# Patient Record
Sex: Female | Born: 1975 | Race: White | Hispanic: No | Smoking: Never smoker
Health system: Southern US, Community
[De-identification: ages and names within clinical notes are randomized; demographics above are authoritative.]

## PROBLEM LIST (undated history)

## (undated) DIAGNOSIS — R569 Unspecified convulsions: Secondary | ICD-10-CM

## (undated) DIAGNOSIS — E46 Unspecified protein-calorie malnutrition: Secondary | ICD-10-CM

## (undated) DIAGNOSIS — K219 Gastro-esophageal reflux disease without esophagitis: Secondary | ICD-10-CM

## (undated) DIAGNOSIS — K900 Celiac disease: Secondary | ICD-10-CM

## (undated) DIAGNOSIS — K589 Irritable bowel syndrome without diarrhea: Secondary | ICD-10-CM

## (undated) DIAGNOSIS — F988 Other specified behavioral and emotional disorders with onset usually occurring in childhood and adolescence: Secondary | ICD-10-CM

## (undated) DIAGNOSIS — F419 Anxiety disorder, unspecified: Secondary | ICD-10-CM

## (undated) DIAGNOSIS — C801 Malignant (primary) neoplasm, unspecified: Secondary | ICD-10-CM

## (undated) HISTORY — DX: Unspecified protein-calorie malnutrition: E46

## (undated) HISTORY — DX: Gastro-esophageal reflux disease without esophagitis: K21.9

## (undated) HISTORY — DX: Irritable bowel syndrome, unspecified: K58.9

## (undated) HISTORY — PX: HEMATOMA EVACUATION: SHX5118

## (undated) HISTORY — PX: HIP SURGERY: SHX245

## (undated) HISTORY — PX: ABDOMINAL SURGERY: SHX537

---

## 2003-12-17 ENCOUNTER — Ambulatory Visit: Payer: Self-pay | Admitting: Obstetrics & Gynecology

## 2008-04-26 ENCOUNTER — Ambulatory Visit: Payer: Self-pay | Admitting: Sports Medicine

## 2010-03-12 ENCOUNTER — Ambulatory Visit: Payer: Self-pay | Admitting: Internal Medicine

## 2017-01-08 ENCOUNTER — Encounter: Payer: Self-pay | Admitting: Emergency Medicine

## 2017-01-08 ENCOUNTER — Emergency Department
Admission: EM | Admit: 2017-01-08 | Discharge: 2017-01-08 | Disposition: A | Discharge status: Home / Self Care | Payer: Self-pay | Attending: Emergency Medicine | Admitting: Emergency Medicine

## 2017-01-08 DIAGNOSIS — Z5321 Procedure and treatment not carried out due to patient leaving prior to being seen by health care provider: Secondary | ICD-10-CM | POA: Insufficient documentation

## 2017-01-08 DIAGNOSIS — M79671 Pain in right foot: Secondary | ICD-10-CM | POA: Insufficient documentation

## 2017-01-08 HISTORY — DX: Unspecified convulsions: R56.9

## 2017-01-08 NOTE — ED Notes (Signed)
Left after triage; insurance is not covered at [employer].

## 2017-01-08 NOTE — ED Triage Notes (Signed)
Had fracture in rightg foot/toe and seen at [facility].  Today she re injured it by dropping a box of rocks on it.

## 2018-03-31 ENCOUNTER — Ambulatory Visit (INDEPENDENT_AMBULATORY_CARE_PROVIDER_SITE_OTHER): Payer: BLUE CROSS/BLUE SHIELD

## 2018-03-31 ENCOUNTER — Encounter: Payer: Self-pay | Admitting: Emergency Medicine

## 2018-03-31 ENCOUNTER — Ambulatory Visit
Admission: EM | Admit: 2018-03-31 | Discharge: 2018-03-31 | Disposition: A | Discharge status: Home / Self Care | Payer: BLUE CROSS/BLUE SHIELD | Attending: Family Medicine | Admitting: Family Medicine

## 2018-03-31 ENCOUNTER — Other Ambulatory Visit: Payer: Self-pay

## 2018-03-31 DIAGNOSIS — S2241XA Multiple fractures of ribs, right side, initial encounter for closed fracture: Secondary | ICD-10-CM | POA: Diagnosis not present

## 2018-03-31 DIAGNOSIS — W541XXA Struck by dog, initial encounter: Secondary | ICD-10-CM | POA: Diagnosis not present

## 2018-03-31 MED ORDER — KETOROLAC TROMETHAMINE 60 MG/2ML IM SOLN
60.0000 mg | Freq: Once | INTRAMUSCULAR | Status: AC
  Administered 2018-03-31: 60 mg via INTRAMUSCULAR

## 2018-03-31 MED ORDER — TRAMADOL HCL 50 MG PO TABS: 50.0000 mg | ORAL_TABLET | Freq: Four times a day (QID) | ORAL | 0 refills | Status: DC | PRN

## 2018-03-31 NOTE — ED Provider Notes (Signed)
[facility]    CSN: [id number] Arrival date & time: 03/31/18  1214     History   Chief Complaint Chief Complaint  Patient presents with  . Chest Pain    HPI Crystal Mckinney is a 43 y.o. female.   HPI  Female accompanied by her husband presents with a right posterior rib pain.  She states that 4 days ago her dog became alarmed and jumped on her pushing her into a corner of the wall. that time she is noticed severe bruising and pain particularly motion.  In addition she has noticed "swelling" in her abdomen.  She normally has a 6 pack from her working out but now i is not as defined.  He has had no nausea vomiting diarrhea.       Past Medical History:  Diagnosis Date  . Seizures ([place])     There are no active problems to display for this patient.   Past Surgical History:  Procedure Laterality Date  . HIP SURGERY Left     OB History   No obstetric history on file.      Home Medications    Prior to Admission medications   Medication Sig Start Date End Date Taking? Authorizing Provider  lamoTRIgine (LAMICTAL) 100 MG tablet Take by mouth. 01/15/18 01/15/19 Yes [provider]  levETIRAcetam (KEPPRA) 500 MG tablet Take by mouth. 01/15/18 01/15/19 Yes [provider]  LORazepam (ATIVAN) 1 MG tablet  03/17/18  Yes [provider]  methylphenidate 27 MG PO TB24 Take by mouth. 10/15/17  Yes [provider]  traMADol (ULTRAM) 50 MG tablet Take 1 tablet (50 mg total) by mouth every 6 (six) hours as needed. 03/31/18   Lorin Picket, PA-C    Family History Family History  Problem Relation Age of Onset  . Healthy Mother   . Healthy Father     Social History Social History   Tobacco Use  . Smoking status: Never Smoker  . Smokeless tobacco: Never Used  Substance Use Topics  . Alcohol use: Yes  . Drug use: Never     Allergies   Dilantin [phenytoin] and Penicillins   Review of Systems Review of Systems    Constitutional: Positive for activity change and appetite change. Negative for chills, fatigue and fever.  Cardiovascular: Positive for chest pain.  Musculoskeletal: Positive for back pain.  All other systems reviewed and are negative.    Physical Exam Triage Vital Signs ED Triage Vitals  Enc Vitals Group     BP 03/31/18 1323 96/69     Pulse Rate 03/31/18 1323 79     Resp 03/31/18 1323 18     Temp 03/31/18 1323 98.5 F (36.9 C)     Temp Source 03/31/18 1323 Oral     SpO2 03/31/18 1323 97 %     Weight 03/31/18 1317 122 lb (55.3 kg)     Height 03/31/18 1317 5' 5"  (1.651 m)     Head Circumference --      Peak Flow --      Pain Score 03/31/18 1316 10     Pain Loc --      Pain Edu? --      Excl. in [place]? --    No data found.  Updated Vital Signs BP 96/69 (BP Location: Left Arm)   Pulse 79   Temp 98.5 F (36.9 C) (Oral)   Resp 18   Ht 5' 5"  (1.651 m)   Wt 122 lb (55.3  kg)   LMP 03/08/2018   SpO2 97%   BMI 20.30 kg/m   Visual Acuity Right Eye Distance:   Left Eye Distance:   Bilateral Distance:    Right Eye Near:   Left Eye Near:    Bilateral Near:     Physical Exam Vitals signs and nursing note reviewed.  Constitutional:      General: She is not in acute distress.    Appearance: She is well-developed and normal weight. She is not ill-appearing, toxic-appearing or diaphoretic.  HENT:     Head: Normocephalic.  Neck:     Musculoskeletal: Normal range of motion.  Chest:     Chest wall: Tenderness and crepitus present.  Abdominal:     General: Bowel sounds are normal. There is no abdominal bruit.     Palpations: Abdomen is soft. There is no hepatomegaly.     Tenderness: There is abdominal tenderness. There is no guarding or rebound.  Skin:    General: Skin is warm and dry.  Neurological:     General: No focal deficit present.     Mental Status: She is alert and oriented to person, place, and time.  Psychiatric:        Mood and Affect: Mood normal.         Behavior: Behavior normal.      UC Treatments / Results  Labs (all labs ordered are listed, but only abnormal results are displayed) Labs Reviewed - No data to display  EKG None  Radiology Dg Ribs Unilateral W/chest Right  Result Date: 03/31/2018 CLINICAL DATA:  43 year old female with a history of right posterior rib pain and bruising from a fall EXAM: RIGHT RIBS AND CHEST - 3+ VIEW COMPARISON:  None. FINDINGS: Cardiomediastinal silhouette within normal limits. No evidence of central vascular congestion. No pneumothorax or pleural effusion. Minimally displaced posterior right eighth rib fracture. Nondisplaced posterior right ninth rib fracture IMPRESSION: Negative for acute cardiopulmonary disease. Posterior right eighth and ninth rib fractures. Electronically Signed   By: Corrie Mckusick D.O.   On: 03/31/2018 14:42    Procedures Procedures (including critical care time)  Medications Ordered in UC Medications  ketorolac (TORADOL) injection 60 mg (60 mg Intramuscular Given 03/31/18 1411)    Initial Impression / Assessment and Plan / UC Course  I have reviewed the triage vital signs and the nursing notes.  Pertinent labs & imaging results that were available during my care of the patient were reviewed by me and considered in my medical decision making (see chart for details).    Reviewed x-rays with the patient and her husband.  The minimally displaced fracture of the eighth rib and also with a nondisplaced fracture of the ninth rib.  Needs to cough and deep breathe despite  the pain.  Will be less if she sleeps on that side or also is able to apply counterpressure with coughing and deep breathing.  Her husband has an incentive spirometer may use to assist with her breathing exercises.  Given a prescription for tramadol for pain.  But have recommended the use of Tylenol 500 mg combined with ibuprofen 400 mg together for pain control.  Follow-up with her primary care physician in 2 weeks.   If she has any changes in her abdomen or has severe shortness of breath or any changes she should go to the emergency room   Final Clinical Impressions(s) / UC Diagnoses   Final diagnoses:  Closed fracture of multiple ribs of right side, initial encounter  Discharge Instructions     Cough and deep breathe frequently.  Try to avoid symptoms as much as possible.  Follow up with your primary care physician in 2 to 3 weeks    ED Prescriptions    Medication Sig Dispense Auth. Provider   traMADol (ULTRAM) 50 MG tablet Take 1 tablet (50 mg total) by mouth every 6 (six) hours as needed. 30 tablet Lorin Picket, PA-C     Controlled Substance Prescriptions [place] Controlled Substance Registry consulted? Not Applicable   Lorin Picket, PA-C 03/31/18 1521

## 2018-03-31 NOTE — Discharge Instructions (Signed)
Cough and deep breathe frequently.  Try to avoid symptoms as much as possible.  Follow up with your primary care physician in 2 to 3 weeks

## 2018-03-31 NOTE — ED Triage Notes (Signed)
Pt c/o right sided rib pain. Her dog jumped on her and knocked her against the point of a wall. This happened about 4 days ago. She has briusing int he area.

## 2018-04-22 ENCOUNTER — Emergency Department: Payer: BLUE CROSS/BLUE SHIELD

## 2018-04-22 ENCOUNTER — Encounter: Payer: Self-pay | Admitting: Emergency Medicine

## 2018-04-22 ENCOUNTER — Ambulatory Visit
Admission: EM | Admit: 2018-04-22 | Discharge: 2018-04-22 | Disposition: A | Discharge status: Home / Self Care | Payer: BLUE CROSS/BLUE SHIELD | Source: Home / Self Care | Attending: Family Medicine | Admitting: Family Medicine

## 2018-04-22 ENCOUNTER — Ambulatory Visit (INDEPENDENT_AMBULATORY_CARE_PROVIDER_SITE_OTHER): Payer: BLUE CROSS/BLUE SHIELD

## 2018-04-22 ENCOUNTER — Observation Stay
Admission: EM | Admit: 2018-04-22 | Discharge: 2018-04-24 | Disposition: A | Discharge status: Home / Self Care | Payer: BLUE CROSS/BLUE SHIELD | Attending: Internal Medicine | Admitting: Internal Medicine

## 2018-04-22 ENCOUNTER — Other Ambulatory Visit: Payer: Self-pay

## 2018-04-22 DIAGNOSIS — Z23 Encounter for immunization: Secondary | ICD-10-CM | POA: Diagnosis not present

## 2018-04-22 DIAGNOSIS — R197 Diarrhea, unspecified: Secondary | ICD-10-CM | POA: Insufficient documentation

## 2018-04-22 DIAGNOSIS — K900 Celiac disease: Secondary | ICD-10-CM | POA: Diagnosis not present

## 2018-04-22 DIAGNOSIS — R109 Unspecified abdominal pain: Principal | ICD-10-CM | POA: Insufficient documentation

## 2018-04-22 DIAGNOSIS — K859 Acute pancreatitis without necrosis or infection, unspecified: Secondary | ICD-10-CM | POA: Diagnosis not present

## 2018-04-22 DIAGNOSIS — F988 Other specified behavioral and emotional disorders with onset usually occurring in childhood and adolescence: Secondary | ICD-10-CM | POA: Diagnosis not present

## 2018-04-22 DIAGNOSIS — S2241XA Multiple fractures of ribs, right side, initial encounter for closed fracture: Secondary | ICD-10-CM | POA: Diagnosis not present

## 2018-04-22 DIAGNOSIS — R739 Hyperglycemia, unspecified: Secondary | ICD-10-CM | POA: Diagnosis not present

## 2018-04-22 DIAGNOSIS — K8500 Idiopathic acute pancreatitis without necrosis or infection: Secondary | ICD-10-CM

## 2018-04-22 DIAGNOSIS — K760 Fatty (change of) liver, not elsewhere classified: Secondary | ICD-10-CM | POA: Diagnosis not present

## 2018-04-22 DIAGNOSIS — Z79899 Other long term (current) drug therapy: Secondary | ICD-10-CM | POA: Diagnosis not present

## 2018-04-22 DIAGNOSIS — R140 Abdominal distension (gaseous): Secondary | ICD-10-CM | POA: Diagnosis present

## 2018-04-22 DIAGNOSIS — G40909 Epilepsy, unspecified, not intractable, without status epilepticus: Secondary | ICD-10-CM | POA: Diagnosis not present

## 2018-04-22 DIAGNOSIS — Z975 Presence of (intrauterine) contraceptive device: Secondary | ICD-10-CM | POA: Diagnosis not present

## 2018-04-22 DIAGNOSIS — F419 Anxiety disorder, unspecified: Secondary | ICD-10-CM | POA: Insufficient documentation

## 2018-04-22 DIAGNOSIS — X58XXXA Exposure to other specified factors, initial encounter: Secondary | ICD-10-CM | POA: Insufficient documentation

## 2018-04-22 HISTORY — DX: Other specified behavioral and emotional disorders with onset usually occurring in childhood and adolescence: F98.8

## 2018-04-22 HISTORY — DX: Celiac disease: K90.0

## 2018-04-22 HISTORY — DX: Anxiety disorder, unspecified: F41.9

## 2018-04-22 LAB — COMPREHENSIVE METABOLIC PANEL
ALT: 30 U/L (ref 0–44)
ALT: 30 U/L (ref 0–44)
AST: 94 U/L — ABNORMAL HIGH (ref 15–41)
AST: 89 U/L — ABNORMAL HIGH (ref 15–41)
Albumin: 4.5 g/dL (ref 3.5–5.0)
Albumin: 4.5 g/dL (ref 3.5–5.0)
Alkaline Phosphatase: 101 U/L (ref 38–126)
Alkaline Phosphatase: 80 U/L (ref 38–126)
Anion gap: 11 (ref 5–15)
Anion gap: 10 (ref 5–15)
BUN: 6 mg/dL (ref 6–20)
BUN: 6 mg/dL (ref 6–20)
CO2: 23 mmol/L (ref 22–32)
CO2: 22 mmol/L (ref 22–32)
Calcium: 9.3 mg/dL (ref 8.9–10.3)
Calcium: 9.6 mg/dL (ref 8.9–10.3)
Chloride: 103 mmol/L (ref 98–111)
Chloride: 101 mmol/L (ref 98–111)
Creatinine, Ser: 0.6 mg/dL (ref 0.44–1.00)
Creatinine, Ser: 0.51 mg/dL (ref 0.44–1.00)
GFR calc Af Amer: >60 mL/min (ref >60)
GFR calc Af Amer: >60 mL/min (ref >60)
GFR calc non Af Amer: >60 mL/min (ref >60)
Glucose, Bld: 121 mg/dL — ABNORMAL HIGH (ref 70–99)
Glucose, Bld: 125 mg/dL — ABNORMAL HIGH (ref 70–99)
POTASSIUM: 4 mmol/L (ref 3.5–5.1)
Potassium: 4.1 mmol/L (ref 3.5–5.1)
Sodium: 137 mmol/L (ref 135–145)
Sodium: 133 mmol/L — ABNORMAL LOW (ref 135–145)
TOTAL PROTEIN: 7.8 g/dL (ref 6.5–8.1)
Total Bilirubin: 0.9 mg/dL (ref 0.3–1.2)
Total Bilirubin: 0.9 mg/dL (ref 0.3–1.2)
Total Protein: 7.7 g/dL (ref 6.5–8.1)

## 2018-04-22 LAB — CBC WITH DIFFERENTIAL/PLATELET
Abs Immature Granulocytes: 0.03 10*3/uL (ref 0.00–0.07)
Basophils Absolute: 0 10*3/uL (ref 0.0–0.1)
Basophils Relative: 0 %
EOS PCT: 1 %
Eosinophils Absolute: 0 10*3/uL (ref 0.0–0.5)
HCT: 35.1 % — ABNORMAL LOW (ref 36.0–46.0)
HEMOGLOBIN: 12.5 g/dL (ref 12.0–15.0)
Immature Granulocytes: 1 %
Lymphocytes Relative: 13 %
Lymphs Abs: 0.8 10*3/uL (ref 0.7–4.0)
MCH: 35.5 pg — ABNORMAL HIGH (ref 26.0–34.0)
MCHC: 35.6 g/dL (ref 30.0–36.0)
MCV: 99.7 fL (ref 80.0–100.0)
MONOS PCT: 13 %
Monocytes Absolute: 0.8 10*3/uL (ref 0.1–1.0)
Neutro Abs: 4.4 10*3/uL (ref 1.7–7.7)
Neutrophils Relative %: 72 %
Platelets: 246 10*3/uL (ref 150–400)
RBC: 3.52 MIL/uL — ABNORMAL LOW (ref 3.87–5.11)
RDW: 11.5 % (ref 11.5–15.5)
WBC: 6.1 10*3/uL (ref 4.0–10.5)
nRBC: 0 % (ref 0.0–0.2)

## 2018-04-22 LAB — URINALYSIS, COMPLETE (UACMP) WITH MICROSCOPIC
Bacteria, UA: NONE SEEN
Bilirubin Urine: NEGATIVE
GLUCOSE, UA: NEGATIVE mg/dL
Hgb urine dipstick: NEGATIVE
Ketones, ur: 5 mg/dL — AB
Leukocytes,Ua: NEGATIVE
Nitrite: NEGATIVE
Protein, ur: 30 mg/dL — AB
Specific Gravity, Urine: 1.023 (ref 1.005–1.030)
pH: 5 (ref 5.0–8.0)

## 2018-04-22 LAB — URINE DRUG SCREEN, QUALITATIVE (ARMC ONLY)
Amphetamines, Ur Screen: NOT DETECTED
Barbiturates, Ur Screen: NOT DETECTED
Benzodiazepine, Ur Scrn: POSITIVE — AB
CANNABINOID 50 NG, UR ~~LOC~~: NOT DETECTED
Cocaine Metabolite,Ur ~~LOC~~: NOT DETECTED
MDMA (Ecstasy)Ur Screen: NOT DETECTED
Methadone Scn, Ur: NOT DETECTED
Opiate, Ur Screen: NOT DETECTED
PHENCYCLIDINE (PCP) UR S: NOT DETECTED
Tricyclic, Ur Screen: NOT DETECTED

## 2018-04-22 LAB — POCT PREGNANCY, URINE: Preg Test, Ur: NEGATIVE

## 2018-04-22 LAB — LIPASE, BLOOD
LIPASE: 440 U/L — AB (ref 11–51)
Lipase: 163 U/L — ABNORMAL HIGH (ref 11–51)

## 2018-04-22 LAB — CBC
HCT: 35.3 % — ABNORMAL LOW (ref 36.0–46.0)
HEMOGLOBIN: 12.5 g/dL (ref 12.0–15.0)
MCH: 34.8 pg — ABNORMAL HIGH (ref 26.0–34.0)
MCHC: 35.4 g/dL (ref 30.0–36.0)
MCV: 98.3 fL (ref 80.0–100.0)
Platelets: 256 10*3/uL (ref 150–400)
RBC: 3.59 MIL/uL — ABNORMAL LOW (ref 3.87–5.11)
RDW: 11.2 % — ABNORMAL LOW (ref 11.5–15.5)
WBC: 6.7 10*3/uL (ref 4.0–10.5)
nRBC: 0 % (ref 0.0–0.2)

## 2018-04-22 LAB — PREGNANCY, URINE: Preg Test, Ur: NEGATIVE

## 2018-04-22 MED ORDER — IOHEXOL 300 MG/ML  SOLN
75.0000 mL | Freq: Once | INTRAMUSCULAR | Status: AC | PRN
  Administered 2018-04-22: 75 mL via INTRAVENOUS

## 2018-04-22 MED ORDER — DIPHENHYDRAMINE HCL 50 MG/ML IJ SOLN
25.0000 mg | Freq: Once | INTRAMUSCULAR | Status: AC
  Administered 2018-04-22: 25 mg via INTRAVENOUS
  Filled 2018-04-22: qty 1

## 2018-04-22 MED ORDER — SODIUM CHLORIDE 0.9% FLUSH
3.0000 mL | Freq: Once | INTRAVENOUS | Status: AC
  Administered 2018-04-22: 3 mL via INTRAVENOUS

## 2018-04-22 MED ORDER — LACTATED RINGERS IV SOLN
INTRAVENOUS | Status: AC
  Administered 2018-04-23 (×2): via INTRAVENOUS

## 2018-04-22 MED ORDER — MORPHINE SULFATE (PF) 4 MG/ML IV SOLN
4.0000 mg | Freq: Once | INTRAVENOUS | Status: AC
  Administered 2018-04-22: 4 mg via INTRAVENOUS
  Filled 2018-04-22: qty 1

## 2018-04-22 MED ORDER — ONDANSETRON HCL 4 MG/2ML IJ SOLN
4.0000 mg | Freq: Once | INTRAMUSCULAR | Status: AC
  Administered 2018-04-22: 4 mg via INTRAVENOUS
  Filled 2018-04-22: qty 2

## 2018-04-22 MED ORDER — IOPAMIDOL (ISOVUE-300) INJECTION 61%
30.0000 mL | Freq: Once | INTRAVENOUS | Status: AC | PRN
  Administered 2018-04-22: 30 mL via ORAL

## 2018-04-22 MED ORDER — HYDROMORPHONE HCL 1 MG/ML IJ SOLN
0.5000 mg | INTRAMUSCULAR | Status: DC | PRN
  Administered 2018-04-23 (×3): 0.5 mg via INTRAVENOUS
  Filled 2018-04-22 (×3): qty 0.5

## 2018-04-22 MED ORDER — SODIUM CHLORIDE 0.9 % IV BOLUS
1000.0000 mL | Freq: Once | INTRAVENOUS | Status: AC
  Administered 2018-04-22: 1000 mL via INTRAVENOUS

## 2018-04-22 NOTE — ED Triage Notes (Signed)
Pt to ED with c/o of LUQ pain that started on Tues. Pt seen at [facility] and sent to ED for elevated Lipase.

## 2018-04-22 NOTE — ED Provider Notes (Signed)
[facility] Emergency Department Provider Note   ____________________________________________   First MD Initiated Contact with Patient 04/22/18 1713     (approximate)  I have reviewed the triage vital signs and the nursing notes.   HISTORY  Chief Complaint Abdominal Pain    HPI Crystal Mckinney is a 43 y.o. female patient came into the urgent care because of right sided rib pain after her dog dog jumped on her she was seen on the 23rd and diagnosed with broken ribs.  Came to urgent care because of abdominal pain and bloating urgent care center here for lipase of 440.  Here patient comes in with continued upper abdominal pain tenderness nausea   Past Medical History:  Diagnosis Date  . ADD (attention deficit disorder)   . Anxiety   . Seizures ([place])     There are no active problems to display for this patient.   Past Surgical History:  Procedure Laterality Date  . HIP SURGERY Left     Prior to Admission medications   Medication Sig Start Date End Date Taking? Authorizing Provider  escitalopram (LEXAPRO) 20 MG tablet Take 1 tablet by mouth daily. 02/28/18  Yes [provider]  lamoTRIgine (LAMICTAL) 100 MG tablet Take 200 mg by mouth 2 (two) times daily.  01/15/18 01/15/19 Yes [provider]  levETIRAcetam (KEPPRA) 500 MG tablet Take 1,500 mg by mouth 2 (two) times daily.  01/15/18 01/15/19 Yes [provider]  LORazepam (ATIVAN) 1 MG tablet Take 1 mg by mouth 2 (two) times daily as needed for anxiety.  03/17/18  Yes [provider]  traMADol (ULTRAM) 50 MG tablet Take 1 tablet (50 mg total) by mouth every 6 (six) hours as needed. 03/31/18  Yes Lorin Picket, PA-C    Allergies Dilantin [phenytoin] and Penicillins  Family History  Problem Relation Age of Onset  . Healthy Mother   . Hyperlipidemia Father     Social History Social History   Tobacco Use  . Smoking status: Never Smoker  . Smokeless tobacco:  Never Used  Substance Use Topics  . Alcohol use: Yes    Comment: social  . Drug use: Never    Review of Systems  Constitutional: No fever/chills Eyes: No visual changes. ENT: No sore throat. Cardiovascular: Denies chest pain. Respiratory: Denies shortness of breath. Gastrointestinal:abdominal pain.  nausea, no vomiting.  No diarrhea.  No constipation. Genitourinary: Negative for dysuria. Musculoskeletal: Negative for back pain. Skin: Negative for rash. Neurological: Negative for headaches, focal weakness or numbness.   ____________________________________________   PHYSICAL EXAM:  VITAL SIGNS: ED Triage Vitals  Enc Vitals Group     BP 04/22/18 1550 132/87     Pulse Rate 04/22/18 1550 91     Resp 04/22/18 1550 18     Temp 04/22/18 1550 99 F (37.2 C)     Temp Source 04/22/18 1550 Oral     SpO2 04/22/18 1550 99 %     Weight --      Height --      Head Circumference --      Peak Flow --      Pain Score 04/22/18 1551 7     Pain Loc --      Pain Edu? --      Excl. in [place]? --     Constitutional: Alert and oriented. Well appearing and in no acute distress. Eyes: Conjunctivae are normal.  Head: Atraumatic. Nose: No congestion/rhinnorhea. Mouth/Throat: Mucous membranes are moist.  Oropharynx non-erythematous. Neck: No stridor.   Cardiovascular: Normal rate, regular rhythm. Grossly normal heart sounds.  Good peripheral circulation. Respiratory: Normal respiratory effort.  No retractions. Lungs CTAB. Gastrointestinal: Soft and nontender. No distention. No abdominal bruits. No CVA tenderness. Musculoskeletal: No lower extremity tenderness nor edema.   Neurologic:  Normal speech and language. No gross focal neurologic deficits are appreciated. N Skin:  Skin is warm, dry and intact. No rash noted.   ____________________________________________   LABS (all labs ordered are listed, but only abnormal results are displayed)  Labs Reviewed  LIPASE, BLOOD - Abnormal;  Notable for the following components:      Result Value   Lipase 163 (*)    All other components within normal limits  COMPREHENSIVE METABOLIC PANEL - Abnormal; Notable for the following components:   Sodium 133 (*)    Glucose, Bld 125 (*)    AST 89 (*)    All other components within normal limits  CBC - Abnormal; Notable for the following components:   RBC 3.59 (*)    HCT 35.3 (*)    MCH 34.8 (*)    RDW 11.2 (*)    All other components within normal limits  URINALYSIS, COMPLETE (UACMP) WITH MICROSCOPIC - Abnormal; Notable for the following components:   Color, Urine YELLOW (*)    APPearance HAZY (*)    Ketones, ur 5 (*)    Protein, ur 30 (*)    All other components within normal limits  PREGNANCY, URINE  POC URINE PREG, ED  POCT PREGNANCY, URINE   ____________________________________________  EKG   ____________________________________________  RADIOLOGY  ED MD interpretation: neg per rads except rib fxs and cecal soft tiss mass  Official radiology report(s): Dg Chest 2 View  Result Date: 04/22/2018 CLINICAL DATA:  Nausea, vomiting and weakness times 2-3 days. EXAM: CHEST - 2 VIEW COMPARISON:  03/31/2018 FINDINGS: The heart size and mediastinal contours are within normal limits. Both lungs are clear. Redemonstration of now subacute right posterior eighth and ninth rib fractures with minimal development of callus involving the eighth rib. No pneumothorax. No pleural effusion. No new osseous abnormality. IMPRESSION: 1. No active cardiopulmonary disease. 2. Subacute right posterior eighth and ninth rib fractures. No pneumothorax. Electronically Signed   By: Ashley Royalty M.D.   On: 04/22/2018 18:27   Ct Abdomen Pelvis W Contrast  Result Date: 04/22/2018 CLINICAL DATA:  Left upper quadrant abdomen pain starting on Tuesday. EXAM: CT ABDOMEN AND PELVIS WITH CONTRAST TECHNIQUE: Multidetector CT imaging of the abdomen and pelvis was performed using the standard protocol following  bolus administration of intravenous contrast. CONTRAST:  59m OMNIPAQUE IOHEXOL 300 MG/ML  SOLN COMPARISON:  None. FINDINGS: Lower chest: No acute abnormality. Hepatobiliary: Diffuse low density of the liver is identified. There is no focal liver lesion. The gallbladder is normal. The biliary tree is normal. Pancreas: Unremarkable. No pancreatic ductal dilatation or surrounding inflammatory changes. Spleen: Normal in size without focal abnormality. Adrenals/Urinary Tract: Adrenal glands are unremarkable. Kidneys are normal, without renal calculi, focal lesion, or hydronephrosis. Bladder is unremarkable. Stomach/Bowel: There is no small bowel obstruction. The stomach is normal. The appendix is not seen but no inflammation is noted around cecum. There is no evidence of diverticulitis. In the cecum, there is 2.3 x 4.4 cm soft tissue density. Vascular/Lymphatic: No significant vascular findings are present. No enlarged abdominal or pelvic lymph nodes. Reproductive: Uterus and bilateral adnexa are unremarkable. IUD is identified in the uterus. Other: None. Musculoskeletal: No acute or significant  osseous findings. IMPRESSION: No acute abnormality identified in the abdomen and pelvis. 4.4 cm soft tissue density identified in the cecum. Although this may be in part due to intra bowel content, underlying mass is not excluded. Recommend barium enema on outpatient basis for further evaluation. Fatty infiltration of liver. Electronically Signed   By: Abelardo Diesel M.D.   On: 04/22/2018 19:35   Dg Abd 2 Views  Result Date: 04/22/2018 CLINICAL DATA:  Abdominal pain and bloating. EXAM: ABDOMEN - 2 VIEW COMPARISON:  Chest x-ray dated 03/31/2018 FINDINGS: There is a single minimally prominent loop of small bowel in the left mid abdomen. No significant bowel dilatation. There is air scattered throughout the bowel. No free air or free fluid. IUD in place. No acute bone abnormality. Surgical pins in the proximal left femur.  Benign-appearing calcifications in the pelvis. IMPRESSION: Negative. Electronically Signed   By: Lorriane Shire M.D.   On: 04/22/2018 12:33    ____________________________________________   PROCEDURES  Procedure(s) performed:   Procedures  Critical Care performed: \  ____________________________________________   INITIAL IMPRESSION / ASSESSMENT AND PLAN / ED COURSE  Patient having apparent allergic reaction to morphine getting itchy and voice changing and flushing          ____________________________________________   FINAL CLINICAL IMPRESSION(S) / ED DIAGNOSES  Final diagnoses:  Acute pancreatitis, unspecified complication status, unspecified pancreatitis type     ED Discharge Orders    None       Note:  This document was prepared using Dragon voice recognition software and may include unintentional dictation errors.    Nena Polio, MD 04/22/18 2123

## 2018-04-22 NOTE — ED Notes (Signed)
Patient transported to X-ray 

## 2018-04-22 NOTE — Discharge Instructions (Signed)
Recommend patient go to Emergency Department for further evaluation and management

## 2018-04-22 NOTE — ED Triage Notes (Signed)
Patient in today c/o right sided rib pain after her dog jumped on her. Patient was seen 03/31/18 and diagnosed with broken ribs. Patient states that she is now having abdominal pain and abdominal bloating.

## 2018-04-22 NOTE — ED Provider Notes (Signed)
[facility]    CSN: [id number] Arrival date & time: 04/22/18  1059     History   Chief Complaint Chief Complaint  Patient presents with  . Chest Pain  . Abdominal Pain    HPI Crystal Mckinney is a 43 y.o. female.   43 yo female seen 3 weeks ago after an injury, with chest pain secondary to 2 rib fractures, presents with a c/o abdominal pain more localized to epigastric area (7-8/10) and bloating for the past week. States chest wall pain from rib fractures has been improving. Denies any vomiting, fevers, chills, urinary symptoms, constipation. She does state that several days ago she had some watery diarrhea which is now improving/resolving.   The history is provided by the patient.    Past Medical History:  Diagnosis Date  . ADD (attention deficit disorder)   . Anxiety   . Seizures ([place])     There are no active problems to display for this patient.   Past Surgical History:  Procedure Laterality Date  . HIP SURGERY Left     OB History   No obstetric history on file.      Home Medications    Prior to Admission medications   Medication Sig Start Date End Date Taking? Authorizing Provider  escitalopram (LEXAPRO) 20 MG tablet Take 1 tablet by mouth daily. 02/28/18  Yes [provider]  lamoTRIgine (LAMICTAL) 100 MG tablet Take by mouth. 01/15/18 01/15/19 Yes [provider]  levETIRAcetam (KEPPRA) 500 MG tablet Take by mouth. 01/15/18 01/15/19 Yes [provider]  LORazepam (ATIVAN) 1 MG tablet  03/17/18  Yes [provider]  methylphenidate 27 MG PO TB24 Take by mouth. 10/15/17   [provider]  traMADol (ULTRAM) 50 MG tablet Take 1 tablet (50 mg total) by mouth every 6 (six) hours as needed. 03/31/18   Lorin Picket, PA-C    Family History Family History  Problem Relation Age of Onset  . Healthy Mother   . Hyperlipidemia Father     Social History Social History   Tobacco Use  . Smoking status: Never  Smoker  . Smokeless tobacco: Never Used  Substance Use Topics  . Alcohol use: Yes    Comment: social  . Drug use: Never     Allergies   Dilantin [phenytoin] and Penicillins   Review of Systems Review of Systems   Physical Exam Triage Vital Signs ED Triage Vitals  Enc Vitals Group     BP 04/22/18 1122 (!) 135/91     Pulse Rate 04/22/18 1122 100     Resp 04/22/18 1122 18     Temp 04/22/18 1122 98 F (36.7 C)     Temp Source 04/22/18 1122 Oral     SpO2 04/22/18 1122 100 %     Weight 04/22/18 1123 124 lb (56.2 kg)     Height 04/22/18 1123 5' 5"  (1.651 m)     Head Circumference --      Peak Flow --      Pain Score 04/22/18 1122 7     Pain Loc --      Pain Edu? --      Excl. in [place]? --    No data found.  Updated Vital Signs BP (!) 135/91 (BP Location: Left Arm)   Pulse 100   Temp 98 F (36.7 C) (Oral)   Resp 18   Ht 5' 5"  (1.651 m)   Wt 56.2 kg   LMP 04/12/2018 (Approximate)  SpO2 100%   BMI 20.63 kg/m   Visual Acuity Right Eye Distance:   Left Eye Distance:   Bilateral Distance:    Right Eye Near:   Left Eye Near:    Bilateral Near:     Physical Exam Vitals signs and nursing note reviewed.  Constitutional:      General: She is not in acute distress.    Appearance: She is well-developed. She is not toxic-appearing or diaphoretic.  Cardiovascular:     Rate and Rhythm: Normal rate.  Pulmonary:     Effort: Pulmonary effort is normal. No respiratory distress.  Abdominal:     General: Bowel sounds are normal. There is no distension.     Palpations: Abdomen is soft. There is no mass.     Tenderness: There is abdominal tenderness (epigastric; no rebound or guarding). There is no right CVA tenderness, left CVA tenderness, guarding or rebound.     Hernia: No hernia is present.  Neurological:     Mental Status: She is alert.      UC Treatments / Results  Labs (all labs ordered are listed, but only abnormal results are displayed) Labs Reviewed  CBC  WITH DIFFERENTIAL/PLATELET - Abnormal; Notable for the following components:      Result Value   RBC 3.52 (*)    HCT 35.1 (*)    MCH 35.5 (*)    All other components within normal limits  COMPREHENSIVE METABOLIC PANEL - Abnormal; Notable for the following components:   Glucose, Bld 121 (*)    AST 94 (*)    All other components within normal limits  LIPASE, BLOOD - Abnormal; Notable for the following components:   Lipase 440 (*)    All other components within normal limits    EKG None  Radiology Dg Abd 2 Views  Result Date: 04/22/2018 CLINICAL DATA:  Abdominal pain and bloating. EXAM: ABDOMEN - 2 VIEW COMPARISON:  Chest x-ray dated 03/31/2018 FINDINGS: There is a single minimally prominent loop of small bowel in the left mid abdomen. No significant bowel dilatation. There is air scattered throughout the bowel. No free air or free fluid. IUD in place. No acute bone abnormality. Surgical pins in the proximal left femur. Benign-appearing calcifications in the pelvis. IMPRESSION: Negative. Electronically Signed   By: Lorriane Shire M.D.   On: 04/22/2018 12:33    Procedures Procedures (including critical care time)  Medications Ordered in UC Medications - No data to display  Initial Impression / Assessment and Plan / UC Course  I have reviewed the triage vital signs and the nursing notes.  Pertinent labs & imaging results that were available during my care of the patient were reviewed by me and considered in my medical decision making (see chart for details).      Final Clinical Impressions(s) / UC Diagnoses   Final diagnoses:  Idiopathic acute pancreatitis, unspecified complication status    ED Prescriptions    None      1. X-ray and lab results and diagnosis reviewed with patient; recommend patient go to Emergency Department for further evaluation and management; report called to triage RN at [facility] ED   Controlled Substance Prescriptions [place] Controlled Substance  Registry consulted? Not Applicable   Norval Gable, MD 04/22/18 1453

## 2018-04-23 ENCOUNTER — Encounter: Payer: Self-pay | Admitting: *Deleted

## 2018-04-23 LAB — C DIFFICILE QUICK SCREEN W PCR REFLEX
C Diff antigen: NEGATIVE
C Diff interpretation: NOT DETECTED
C Diff toxin: NEGATIVE

## 2018-04-23 LAB — BASIC METABOLIC PANEL
Anion gap: 5 (ref 5–15)
BUN: <5 mg/dL — ABNORMAL LOW (ref 6–20)
CO2: 26 mmol/L (ref 22–32)
CREATININE: 0.45 mg/dL (ref 0.44–1.00)
Calcium: 8.7 mg/dL — ABNORMAL LOW (ref 8.9–10.3)
Chloride: 108 mmol/L (ref 98–111)
GFR calc Af Amer: >60 mL/min (ref >60)
GFR calc non Af Amer: >60 mL/min (ref >60)
Glucose, Bld: 91 mg/dL (ref 70–99)
Potassium: 3.9 mmol/L (ref 3.5–5.1)
Sodium: 139 mmol/L (ref 135–145)

## 2018-04-23 LAB — GAMMA GT: GGT: 192 U/L — AB (ref 7–50)

## 2018-04-23 LAB — PHOSPHORUS: PHOSPHORUS: 3.7 mg/dL (ref 2.5–4.6)

## 2018-04-23 LAB — LACTATE DEHYDROGENASE: LDH: 109 U/L (ref 98–192)

## 2018-04-23 LAB — MAGNESIUM: Magnesium: 1.6 mg/dL — ABNORMAL LOW (ref 1.7–2.4)

## 2018-04-23 LAB — ETHANOL: Alcohol, Ethyl (B): <10 mg/dL (ref <10)

## 2018-04-23 LAB — LACTOFERRIN, FECAL, QUALITATIVE: Lactoferrin, Fecal, Qual: NEGATIVE

## 2018-04-23 MED ORDER — ESCITALOPRAM OXALATE 10 MG PO TABS
20.0000 mg | ORAL_TABLET | Freq: Every day | ORAL | Status: DC
  Administered 2018-04-23: 20 mg via ORAL
  Filled 2018-04-23 (×2): qty 2

## 2018-04-23 MED ORDER — LORAZEPAM 1 MG PO TABS: 1.0000 mg | ORAL_TABLET | Freq: Two times a day (BID) | ORAL | Status: DC | PRN

## 2018-04-23 MED ORDER — MAGNESIUM SULFATE 2 GM/50ML IV SOLN
2.0000 g | Freq: Once | INTRAVENOUS | Status: AC
  Administered 2018-04-23: 2 g via INTRAVENOUS
  Filled 2018-04-23: qty 50

## 2018-04-23 MED ORDER — BISACODYL 5 MG PO TBEC: 5.0000 mg | DELAYED_RELEASE_TABLET | Freq: Every day | ORAL | Status: DC | PRN

## 2018-04-23 MED ORDER — LEVETIRACETAM 500 MG PO TABS
1500.0000 mg | ORAL_TABLET | Freq: Two times a day (BID) | ORAL | Status: DC
  Administered 2018-04-23: 1500 mg via ORAL
  Filled 2018-04-23 (×3): qty 3

## 2018-04-23 MED ORDER — ENOXAPARIN SODIUM 40 MG/0.4ML ~~LOC~~ SOLN: 40.0000 mg | SUBCUTANEOUS | Status: DC

## 2018-04-23 MED ORDER — ONDANSETRON HCL 4 MG/2ML IJ SOLN: 4.0000 mg | Freq: Four times a day (QID) | INTRAMUSCULAR | Status: DC | PRN

## 2018-04-23 MED ORDER — LAMOTRIGINE 100 MG PO TABS
200.0000 mg | ORAL_TABLET | Freq: Two times a day (BID) | ORAL | Status: DC
  Administered 2018-04-23: 200 mg via ORAL
  Filled 2018-04-23 (×3): qty 2

## 2018-04-23 MED ORDER — ACETAMINOPHEN 325 MG PO TABS: 650.0000 mg | ORAL_TABLET | Freq: Four times a day (QID) | ORAL | Status: DC | PRN

## 2018-04-23 MED ORDER — SENNOSIDES-DOCUSATE SODIUM 8.6-50 MG PO TABS: 1.0000 | ORAL_TABLET | Freq: Every evening | ORAL | Status: DC | PRN

## 2018-04-23 MED ORDER — ONDANSETRON HCL 4 MG PO TABS: 4.0000 mg | ORAL_TABLET | Freq: Four times a day (QID) | ORAL | Status: DC | PRN

## 2018-04-23 MED ORDER — INFLUENZA VAC SPLIT QUAD 0.5 ML IM SUSY
0.5000 mL | PREFILLED_SYRINGE | INTRAMUSCULAR | Status: AC
  Administered 2018-04-24: 0.5 mL via INTRAMUSCULAR
  Filled 2018-04-23: qty 0.5

## 2018-04-23 MED ORDER — ACETAMINOPHEN 650 MG RE SUPP: 650.0000 mg | Freq: Four times a day (QID) | RECTAL | Status: DC | PRN

## 2018-04-23 NOTE — Progress Notes (Signed)
[facility] NAME: Crystal Mckinney    MR#:  [id number]  DATE OF BIRTH:  February 20, 1976  SUBJECTIVE: Patient admitted for abdominal pain, diarrhea, nausea, has been having the symptoms for the last 3 to 4 days.  Main complaint is left lower quadrant abdominal pain, diarrhea, has history of celiac disease and gets explosive fatty diarrhea if she is not careful with her diet.  This morning she mainly complains of left lower quadrant abdominal pain, some nausea but overall no vomiting.  CHIEF COMPLAINT:   Chief Complaint  Patient presents with  . Abdominal Pain    REVIEW OF SYSTEMS:   ROS CONSTITUTIONAL: No fever, fatigue or weakness.  EYES: No blurred or double vision.  EARS, NOSE, AND THROAT: No tinnitus or ear pain.  RESPIRATORY: No cough, shortness of breath, wheezing or hemoptysis.  CARDIOVASCULAR: No chest pain, orthopnea, edema.  GASTROINTESTINAL: Abdominal pain, diarrhea, nausea. GENITOURINARY: No dysuria, hematuria.  ENDOCRINE: No polyuria, nocturia,  HEMATOLOGY: No anemia, easy bruising or bleeding SKIN: No rash or lesion. MUSCULOSKELETAL: No joint pain or arthritis.   NEUROLOGIC: No tingling, numbness, weakness.  PSYCHIATRY: No anxiety or depression.   DRUG ALLERGIES:   Allergies  Allergen Reactions  . Dilantin [Phenytoin] Anaphylaxis  . Penicillins Anaphylaxis    VITALS:  Blood pressure 123/84, pulse 68, temperature 98.6 F (37 C), temperature source Oral, resp. rate 15, height 5' 5"  (1.651 m), weight 56.6 kg, last menstrual period 04/12/2018, SpO2 98 %.  PHYSICAL EXAMINATION:  GENERAL:  43 y.o.-year-old patient lying in the bed with no acute distress.  EYES: Pupils equal, round, reactive to lightNo scleral icterus. Extraocular muscles intact.  HEENT: Head atraumatic, normocephalic. Oropharynx and nasopharynx clear.  NECK:  Supple, no jugular venous distention. No thyroid enlargement, no tenderness.  LUNGS:  Normal breath sounds bilaterally, no wheezing, rales,rhonchi or crepitation. No use of accessory muscles of respiration.  CARDIOVASCULAR: S1, S2 normal. No murmurs, rubs, or gallops.  ABDOMEN: Slightly distended, tenderness in the left lower quadrant, bowel sounds present. EXTREMITIES: No pedal edema, cyanosis, or clubbing.  NEUROLOGIC: Cranial nerves II through XII are intact. Muscle strength 5/5 in all extremities. Sensation intact. Gait not checked.  PSYCHIATRIC: The patient is alert and oriented x 3.  SKIN: No obvious rash, lesion, or ulcer.    LABORATORY PANEL:   CBC Recent Labs  Lab 04/22/18 1555  WBC 6.7  HGB 12.5  HCT 35.3*  PLT 256   ------------------------------------------------------------------------------------------------------------------  Chemistries  Recent Labs  Lab 04/22/18 1555 04/23/18 0446  NA 133* 139  K 4.0 3.9  CL 101 108  CO2 22 26  GLUCOSE 125* 91  BUN 6 <5*  CREATININE 0.51 0.45  CALCIUM 9.6 8.7*  MG 1.6*  --   AST 89*  --   ALT 30  --   ALKPHOS 80  --   BILITOT 0.9  --    ------------------------------------------------------------------------------------------------------------------  Cardiac Enzymes No results for input(s): TROPONINI in the last 168 hours. ------------------------------------------------------------------------------------------------------------------  RADIOLOGY:  Dg Chest 2 View  Result Date: 04/22/2018 CLINICAL DATA:  Nausea, vomiting and weakness times 2-3 days. EXAM: CHEST - 2 VIEW COMPARISON:  03/31/2018 FINDINGS: The heart size and mediastinal contours are within normal limits. Both lungs are clear. Redemonstration of now subacute right posterior eighth and ninth rib fractures with minimal development of callus involving the eighth rib. No pneumothorax. No pleural effusion. No new osseous abnormality. IMPRESSION: 1. No active cardiopulmonary disease. 2. Subacute right  posterior eighth and ninth rib fractures.  No pneumothorax. Electronically Signed   By: Ashley Royalty M.D.   On: 04/22/2018 18:27   Ct Abdomen Pelvis W Contrast  Result Date: 04/22/2018 CLINICAL DATA:  Left upper quadrant abdomen pain starting on Tuesday. EXAM: CT ABDOMEN AND PELVIS WITH CONTRAST TECHNIQUE: Multidetector CT imaging of the abdomen and pelvis was performed using the standard protocol following bolus administration of intravenous contrast. CONTRAST:  56m OMNIPAQUE IOHEXOL 300 MG/ML  SOLN COMPARISON:  None. FINDINGS: Lower chest: No acute abnormality. Hepatobiliary: Diffuse low density of the liver is identified. There is no focal liver lesion. The gallbladder is normal. The biliary tree is normal. Pancreas: Unremarkable. No pancreatic ductal dilatation or surrounding inflammatory changes. Spleen: Normal in size without focal abnormality. Adrenals/Urinary Tract: Adrenal glands are unremarkable. Kidneys are normal, without renal calculi, focal lesion, or hydronephrosis. Bladder is unremarkable. Stomach/Bowel: There is no small bowel obstruction. The stomach is normal. The appendix is not seen but no inflammation is noted around cecum. There is no evidence of diverticulitis. In the cecum, there is 2.3 x 4.4 cm soft tissue density. Vascular/Lymphatic: No significant vascular findings are present. No enlarged abdominal or pelvic lymph nodes. Reproductive: Uterus and bilateral adnexa are unremarkable. IUD is identified in the uterus. Other: None. Musculoskeletal: No acute or significant osseous findings. IMPRESSION: No acute abnormality identified in the abdomen and pelvis. 4.4 cm soft tissue density identified in the cecum. Although this may be in part due to intra bowel content, underlying mass is not excluded. Recommend barium enema on outpatient basis for further evaluation. Fatty infiltration of liver. Electronically Signed   By: WAbelardo DieselM.D.   On: 04/22/2018 19:35   Dg Abd 2 Views  Result Date: 04/22/2018 CLINICAL DATA:  Abdominal  pain and bloating. EXAM: ABDOMEN - 2 VIEW COMPARISON:  Chest x-ray dated 03/31/2018 FINDINGS: There is a single minimally prominent loop of small bowel in the left mid abdomen. No significant bowel dilatation. There is air scattered throughout the bowel. No free air or free fluid. IUD in place. No acute bone abnormality. Surgical pins in the proximal left femur. Benign-appearing calcifications in the pelvis. IMPRESSION: Negative. Electronically Signed   By: JLorriane ShireM.D.   On: 04/22/2018 12:33    EKG:  No orders found for this or any previous visit.  ASSESSMENT AND PLAN:   43year old female with history of celiac disease, had multiple colonoscopies, [facility] [place], vomiting, abdominal pain.  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 2.  Diarrhea, check stool for C. difficile, continue enteric precautions, if C. difficile test negative discontinue isolation. 3.  Cecal mass seen on the CT scan, requires outpatient colonoscopy, discussed about this with patient, patient to see gastroenterology as an outpatient for colonoscopy. 4.  Depression: Continue Lexapro. 5.  She of seizures, continue Lamictal, Keppra. #6. recent right rib fracture as her dog fell on her, patient noted to have minimally displaced fracture of eighth rib and also nondisplaced fracture of ninth rib. More than 50% time spent in counseling, coordination of care. All the records are reviewed and case discussed with Care Management/Social Workerr. Management plans discussed with the patient, family and they are in agreement.  CODE STATUS: full  TOTAL TIME TAKING CARE OF THIS PATIENT: 332mutes.  POSSIBLE D/C IN 1-2DAYS, DEPENDING ON CLINICAL  CONDITION.   Epifanio Lesches M.D on 04/23/2018 at 12:34 PM  Between 7am to 6pm - Pager - [phone]  After 6pm go to [email or web address] - password [email or web address] [facility]  Office  [phone]  CC: Primary care physician; Patient, No Pcp Per   Note: This dictation was prepared with Dragon dictation along with smaller phrase technology. Any transcriptional errors that result from this process are unintentional.

## 2018-04-23 NOTE — Consult Note (Signed)
[facility] GI Inpatient Consult Note   Kathline Magic, M.D.  Reason for Consult: Abdominal pain, increased lipase   Attending Requesting Consult: Carol Ada, M.D.   History of Present Illness: Crystal Mckinney is a 43 y.o. female with a history of celiac disease presenting yesterday for worsening epigastric and left-sided and right-sided abdominal pain after being seen in the urgent care clinic.  Patient describes the pain as "severe crampy" pain that can radiate from the epigastrium to both lower quadrants of the abdomen.  There is been some explosive watery diarrhea over the last couple days but that appears to be improving.  Reportedly stool samples were done but I do not see those in the computer.  No recent antibiotics. The patient had a serum lipase of 442 and was admitted for presumptive pancreatitis.  CT scan, however, was unremarkable other than a possible density in the cecum.  Malignancy cannot be ruled out.  Patient says she has diffuse pain worse on the right upper quadrant as she had cracked a few ribs around 23 January after her dog jumped on her and she fell.  Denies melena, hematochezia or vomiting.  There is moderate nausea with some symptoms of anorexia. Patient ports a previous colonoscopy "around 2013" in [place] at which time there were some "polyps removed". There is no significant alcohol history.  Past Medical History:  Past Medical History:  Diagnosis Date  . ADD (attention deficit disorder)   . Anxiety   . Celiac disease   . Seizures ([place])     Problem List: Patient Active Problem List   Diagnosis Date Noted  . Abdominal pain 04/22/2018    Past Surgical History: Past Surgical History:  Procedure Laterality Date  . HIP SURGERY Left     Allergies: Allergies  Allergen Reactions  . Dilantin [Phenytoin] Anaphylaxis  . Penicillins Anaphylaxis    Home Medications: Medications Prior to Admission  Medication Sig Dispense  Refill Last Dose  . escitalopram (LEXAPRO) 20 MG tablet Take 1 tablet by mouth daily.   04/22/2018 at 0900  . lamoTRIgine (LAMICTAL) 100 MG tablet Take 200 mg by mouth 2 (two) times daily.    04/22/2018 at 0900  . levETIRAcetam (KEPPRA) 500 MG tablet Take 1,500 mg by mouth 2 (two) times daily.    04/22/2018 at 0900  . LORazepam (ATIVAN) 1 MG tablet Take 1 mg by mouth 2 (two) times daily as needed for anxiety.    prn at prn  . traMADol (ULTRAM) 50 MG tablet Take 1 tablet (50 mg total) by mouth every 6 (six) hours as needed. 30 tablet 0 prn at prn   Home medication reconciliation was completed with the patient.   Scheduled Inpatient Medications:   . enoxaparin (LOVENOX) injection  40 mg Subcutaneous Q24H  . escitalopram  20 mg Oral Daily  . [START ON 04/24/2018] Influenza vac split quadrivalent PF  0.5 mL Intramuscular Tomorrow-1000  . lamoTRIgine  200 mg Oral BID  . levETIRAcetam  1,500 mg Oral BID    Continuous Inpatient Infusions:   . lactated ringers Stopped (04/23/18 1014)    PRN Inpatient Medications:  acetaminophen **OR** acetaminophen, bisacodyl, HYDROmorphone (DILAUDID) injection, LORazepam, ondansetron **OR** ondansetron (ZOFRAN) IV, senna-docusate  Family History: family history includes Healthy in her mother; Hyperlipidemia in her father.   GI Family History: Negative  Social History:   reports that she has never smoked. She has never used smokeless tobacco. She reports current alcohol use. She reports that she does  not use drugs. The patient denies ETOH, tobacco, or drug use.    Review of Systems: Review of Systems - General ROS: negative Psychological ROS: negative Ophthalmic ROS: negative ENT ROS: negative Allergy and Immunology ROS: negative Hematological and Lymphatic ROS: negative for - bleeding problems, blood clots or night sweats Respiratory ROS: no cough, shortness of breath, or wheezing Cardiovascular ROS: no chest pain or dyspnea on exertion Genito-Urinary  ROS: no dysuria, trouble voiding, or hematuria Musculoskeletal ROS: negative Neurological ROS: no TIA or stroke symptoms Dermatological ROS: negative  Physical Examination: BP 118/72 (BP Location: Left Arm)   Pulse 61   Temp 97.9 F (36.6 C) (Oral)   Resp 18   Ht 5' 5"  (1.651 m)   Wt 56.6 kg   LMP 04/12/2018 (Approximate)   SpO2 100%   BMI 20.76 kg/m  Physical Exam Constitutional:      General: She is not in acute distress.    Appearance: She is well-developed and normal weight. She is not ill-appearing or toxic-appearing.  HENT:     Head: Normocephalic and atraumatic.     Mouth/Throat:     Mouth: Mucous membranes are moist.     Pharynx: No pharyngeal swelling.  Eyes:     Extraocular Movements: Extraocular movements intact.     Pupils: Pupils are equal, round, and reactive to light.  Cardiovascular:     Rate and Rhythm: Normal rate.     Heart sounds: Normal heart sounds.  Pulmonary:     Effort: Pulmonary effort is normal.     Breath sounds: Normal breath sounds.  Abdominal:     General: Abdomen is flat.     Tenderness: There is abdominal tenderness in the right upper quadrant, left upper quadrant and left lower quadrant. There is no guarding. Negative signs include Murphy's sign, Rovsing's sign, McBurney's sign, psoas sign and obturator sign.     Hernia: No hernia is present.  Skin:    General: Skin is warm and dry.  Neurological:     General: No focal deficit present.     Mental Status: She is alert.     Data: Lab Results  Component Value Date   WBC 6.7 04/22/2018   HGB 12.5 04/22/2018   HCT 35.3 (L) 04/22/2018   MCV 98.3 04/22/2018   PLT 256 04/22/2018   Recent Labs  Lab 04/22/18 1235 04/22/18 1555  HGB 12.5 12.5   Lab Results  Component Value Date   NA 139 04/23/2018   K 3.9 04/23/2018   CL 108 04/23/2018   CO2 26 04/23/2018   BUN <5 (L) 04/23/2018   CREATININE 0.45 04/23/2018   Lab Results  Component Value Date   ALT 30 04/22/2018   AST 89  (H) 04/22/2018   ALKPHOS 80 04/22/2018   BILITOT 0.9 04/22/2018   No results for input(s): APTT, INR, PTT in the last 168 hours. CBC Latest Ref Rng & Units 04/22/2018 04/22/2018  WBC 4.0 - 10.5 K/uL 6.7 6.1  Hemoglobin 12.0 - 15.0 g/dL 12.5 12.5  Hematocrit 36.0 - 46.0 % 35.3(L) 35.1(L)  Platelets 150 - 400 K/uL 256 246    STUDIES: Dg Chest 2 View  Result Date: 04/22/2018 CLINICAL DATA:  Nausea, vomiting and weakness times 2-3 days. EXAM: CHEST - 2 VIEW COMPARISON:  03/31/2018 FINDINGS: The heart size and mediastinal contours are within normal limits. Both lungs are clear. Redemonstration of now subacute right posterior eighth and ninth rib fractures with minimal development of callus involving the eighth rib. No  pneumothorax. No pleural effusion. No new osseous abnormality. IMPRESSION: 1. No active cardiopulmonary disease. 2. Subacute right posterior eighth and ninth rib fractures. No pneumothorax. Electronically Signed   By: Ashley Royalty M.D.   On: 04/22/2018 18:27   Ct Abdomen Pelvis W Contrast  Result Date: 04/22/2018 CLINICAL DATA:  Left upper quadrant abdomen pain starting on Tuesday. EXAM: CT ABDOMEN AND PELVIS WITH CONTRAST TECHNIQUE: Multidetector CT imaging of the abdomen and pelvis was performed using the standard protocol following bolus administration of intravenous contrast. CONTRAST:  59m OMNIPAQUE IOHEXOL 300 MG/ML  SOLN COMPARISON:  None. FINDINGS: Lower chest: No acute abnormality. Hepatobiliary: Diffuse low density of the liver is identified. There is no focal liver lesion. The gallbladder is normal. The biliary tree is normal. Pancreas: Unremarkable. No pancreatic ductal dilatation or surrounding inflammatory changes. Spleen: Normal in size without focal abnormality. Adrenals/Urinary Tract: Adrenal glands are unremarkable. Kidneys are normal, without renal calculi, focal lesion, or hydronephrosis. Bladder is unremarkable. Stomach/Bowel: There is no small bowel obstruction. The  stomach is normal. The appendix is not seen but no inflammation is noted around cecum. There is no evidence of diverticulitis. In the cecum, there is 2.3 x 4.4 cm soft tissue density. Vascular/Lymphatic: No significant vascular findings are present. No enlarged abdominal or pelvic lymph nodes. Reproductive: Uterus and bilateral adnexa are unremarkable. IUD is identified in the uterus. Other: None. Musculoskeletal: No acute or significant osseous findings. IMPRESSION: No acute abnormality identified in the abdomen and pelvis. 4.4 cm soft tissue density identified in the cecum. Although this may be in part due to intra bowel content, underlying mass is not excluded. Recommend barium enema on outpatient basis for further evaluation. Fatty infiltration of liver. Electronically Signed   By: WAbelardo DieselM.D.   On: 04/22/2018 19:35   Dg Abd 2 Views  Result Date: 04/22/2018 CLINICAL DATA:  Abdominal pain and bloating. EXAM: ABDOMEN - 2 VIEW COMPARISON:  Chest x-ray dated 03/31/2018 FINDINGS: There is a single minimally prominent loop of small bowel in the left mid abdomen. No significant bowel dilatation. There is air scattered throughout the bowel. No free air or free fluid. IUD in place. No acute bone abnormality. Surgical pins in the proximal left femur. Benign-appearing calcifications in the pelvis. IMPRESSION: Negative. Electronically Signed   By: JLorriane ShireM.D.   On: 04/22/2018 12:33   @IMAGES @  Assessment: 1. Elevated serum lipase - Improved with normal WBC at admission.  This is possibly the tail end of a recent attack of idiopathic pancreatitis versus spurious elevation of serum lipase related to some unknown medication or other environmental effect.  2.  Abnormal cecal lesion noted on CT scan-requires outpatient colonoscopy to reconcile this finding.  3.  Acute gastroenteritis, possibly viral- stool studies were not back yet I will see if they would have been obtained.  Recommendations: 1.   Check stool studies. 2.  Continue brisk IV hydration. 3.  Begin liquid diet and advance as tolerated. 4.  I have advised the patient to get outpatient colonoscopy.  The patient was given my business card and will follow-up with me in the outpatient setting.  She is agreeable to having the procedure performed. 5.  Could possibly be discharged later today or tomorrow from a GI standpoint if continues to progress well.  I will see the patient tomorrow if she is still here in the hospital.  Thank you for the consult. Please call with questions or concerns.  [place] "KLanny Hurst MD [facility]Henry [facility]  [address], [place] [address] [phone]  04/23/2018 11:12 AM

## 2018-04-23 NOTE — H&P (Signed)
[facility] NAME: Crystal Mckinney    MR#:  [id number]  DATE OF BIRTH:  14-Mar-1975  DATE OF ADMISSION:  04/22/2018  PRIMARY CARE PHYSICIAN: Patient, No Pcp Per   REQUESTING/REFERRING PHYSICIAN: Nena Polio, MD  CHIEF COMPLAINT:   Chief Complaint  Patient presents with  . Abdominal Pain    HISTORY OF PRESENT ILLNESS:  Crystal Mckinney  is a 43 y.o. female with a known history of Sz D/O, celiac Dx p/w AP. States she is very careful with her diet 2/2 celiac disease. Followed by [facility]. Believes her last colonoscopy was in 2013 or 2014. Developed AP on Mon 2/10 evening, epigastric + periumbilical + diffuse. Bloating/distension. Diarrhea onset Tues 2/11, watery. PO intolerance, nausea, feels as though food getting stuck in chest. Drinks 1 glass of wine every 3 days. No drugs. No coffee. No NSAIDs. Takes antiepileptics (Lamictal, Keppra). No recent changes in medication. Saw urgent care 2/14 morning, lipase 440, sent to ED. Repeat lipase in ED 163. CT A/P report reads, "No acute abnormality identified in the abdomen and pelvis."  PAST MEDICAL HISTORY:   Past Medical History:  Diagnosis Date  . ADD (attention deficit disorder)   . Anxiety   . Celiac disease   . Seizures ([place])     PAST SURGICAL HISTORY:   Past Surgical History:  Procedure Laterality Date  . HIP SURGERY Left     SOCIAL HISTORY:   Social History   Tobacco Use  . Smoking status: Never Smoker  . Smokeless tobacco: Never Used  Substance Use Topics  . Alcohol use: Yes    Comment: social    FAMILY HISTORY:   Family History  Problem Relation Age of Onset  . Healthy Mother   . Hyperlipidemia Father     DRUG ALLERGIES:   Allergies  Allergen Reactions  . Dilantin [Phenytoin] Anaphylaxis  . Penicillins Anaphylaxis    REVIEW OF SYSTEMS:   Review of Systems  Constitutional: Positive for malaise/fatigue. Negative for chills, diaphoresis, fever and weight  loss.  HENT: Negative for congestion, ear pain, hearing loss, nosebleeds, sinus pain, sore throat and tinnitus.   Eyes: Negative for blurred vision, double vision and photophobia.  Respiratory: Negative for cough, hemoptysis, sputum production, shortness of breath and wheezing.   Cardiovascular: Negative for chest pain, palpitations, orthopnea, claudication, leg swelling and PND.  Gastrointestinal: Positive for abdominal pain, diarrhea, nausea and vomiting. Negative for blood in stool, constipation, heartburn and melena.  Genitourinary: Negative for dysuria, frequency, hematuria and urgency.  Musculoskeletal: Negative for joint pain, myalgias and neck pain.  Skin: Negative for itching and rash.  Neurological: Positive for weakness. Negative for dizziness, tingling, tremors, sensory change, speech change, focal weakness, seizures, loss of consciousness and headaches.  Psychiatric/Behavioral: Negative for depression and memory loss. The patient is not nervous/anxious and does not have insomnia.    MEDICATIONS AT HOME:   Prior to Admission medications   Medication Sig Start Date End Date Taking? Authorizing Provider  escitalopram (LEXAPRO) 20 MG tablet Take 1 tablet by mouth daily. 02/28/18  Yes [provider]  lamoTRIgine (LAMICTAL) 100 MG tablet Take 200 mg by mouth 2 (two) times daily.  01/15/18 01/15/19 Yes [provider]  levETIRAcetam (KEPPRA) 500 MG tablet Take 1,500 mg by mouth 2 (two) times daily.  01/15/18 01/15/19 Yes [provider]  LORazepam (ATIVAN) 1 MG tablet Take 1 mg by mouth 2 (two) times daily as needed for anxiety.  03/17/18  Yes [provider]  traMADol (ULTRAM) 50 MG tablet Take 1 tablet (50 mg total) by mouth every 6 (six) hours as needed. 03/31/18  Yes Lorin Picket, PA-C      VITAL SIGNS:  Blood pressure 130/79, pulse 69, temperature 98.4 F (36.9 C), temperature source Oral, resp. rate 18, height 5' 5"  (1.651 m), weight 56.6 kg,  last menstrual period 04/12/2018, SpO2 100 %.  PHYSICAL EXAMINATION:  Physical Exam Constitutional:      General: She is not in acute distress.    Appearance: She is ill-appearing. She is not toxic-appearing or diaphoretic.  HENT:     Head: Atraumatic.     Mouth/Throat:     Pharynx: Oropharynx is clear.  Eyes:     General: No scleral icterus.    Extraocular Movements: Extraocular movements intact.     Conjunctiva/sclera: Conjunctivae normal.  Neck:     Musculoskeletal: Neck supple.  Cardiovascular:     Rate and Rhythm: Normal rate and regular rhythm.     Heart sounds: Normal heart sounds. No murmur. No friction rub. No gallop.   Pulmonary:     Effort: No respiratory distress.     Breath sounds: Normal breath sounds. No stridor. No wheezing or rales.  Abdominal:     General: Bowel sounds are increased. There is distension.     Palpations: Abdomen is soft.     Tenderness: There is generalized abdominal tenderness and tenderness in the epigastric area. There is no guarding or rebound.  Musculoskeletal: Normal range of motion.        General: No swelling or tenderness.  Lymphadenopathy:     Cervical: No cervical adenopathy.  Skin:    General: Skin is warm and dry.     Findings: No erythema or rash.  Neurological:     Mental Status: She is alert and oriented to person, place, and time. Mental status is at baseline.  Psychiatric:        Mood and Affect: Mood normal.        Behavior: Behavior normal.        Thought Content: Thought content normal.        Judgment: Judgment normal.    LABORATORY PANEL:   CBC Recent Labs  Lab 04/22/18 1555  WBC 6.7  HGB 12.5  HCT 35.3*  PLT 256   ------------------------------------------------------------------------------------------------------------------  Chemistries  Recent Labs  Lab 04/22/18 1555  NA 133*  K 4.0  CL 101  CO2 22  GLUCOSE 125*  BUN 6  CREATININE 0.51  CALCIUM 9.6  MG 1.6*  AST 89*  ALT 30  ALKPHOS 80   BILITOT 0.9   ------------------------------------------------------------------------------------------------------------------  Cardiac Enzymes No results for input(s): TROPONINI in the last 168 hours. ------------------------------------------------------------------------------------------------------------------  RADIOLOGY:  Dg Chest 2 View  Result Date: 04/22/2018 CLINICAL DATA:  Nausea, vomiting and weakness times 2-3 days. EXAM: CHEST - 2 VIEW COMPARISON:  03/31/2018 FINDINGS: The heart size and mediastinal contours are within normal limits. Both lungs are clear. Redemonstration of now subacute right posterior eighth and ninth rib fractures with minimal development of callus involving the eighth rib. No pneumothorax. No pleural effusion. No new osseous abnormality. IMPRESSION: 1. No active cardiopulmonary disease. 2. Subacute right posterior eighth and ninth rib fractures. No pneumothorax. Electronically Signed   By: Ashley Royalty M.D.   On: 04/22/2018 18:27   Ct Abdomen Pelvis W Contrast  Result Date: 04/22/2018 CLINICAL DATA:  Left upper quadrant abdomen pain starting on Tuesday. EXAM: CT  ABDOMEN AND PELVIS WITH CONTRAST TECHNIQUE: Multidetector CT imaging of the abdomen and pelvis was performed using the standard protocol following bolus administration of intravenous contrast. CONTRAST:  94m OMNIPAQUE IOHEXOL 300 MG/ML  SOLN COMPARISON:  None. FINDINGS: Lower chest: No acute abnormality. Hepatobiliary: Diffuse low density of the liver is identified. There is no focal liver lesion. The gallbladder is normal. The biliary tree is normal. Pancreas: Unremarkable. No pancreatic ductal dilatation or surrounding inflammatory changes. Spleen: Normal in size without focal abnormality. Adrenals/Urinary Tract: Adrenal glands are unremarkable. Kidneys are normal, without renal calculi, focal lesion, or hydronephrosis. Bladder is unremarkable. Stomach/Bowel: There is no small bowel obstruction. The  stomach is normal. The appendix is not seen but no inflammation is noted around cecum. There is no evidence of diverticulitis. In the cecum, there is 2.3 x 4.4 cm soft tissue density. Vascular/Lymphatic: No significant vascular findings are present. No enlarged abdominal or pelvic lymph nodes. Reproductive: Uterus and bilateral adnexa are unremarkable. IUD is identified in the uterus. Other: None. Musculoskeletal: No acute or significant osseous findings. IMPRESSION: No acute abnormality identified in the abdomen and pelvis. 4.4 cm soft tissue density identified in the cecum. Although this may be in part due to intra bowel content, underlying mass is not excluded. Recommend barium enema on outpatient basis for further evaluation. Fatty infiltration of liver. Electronically Signed   By: WAbelardo DieselM.D.   On: 04/22/2018 19:35   Dg Abd 2 Views  Result Date: 04/22/2018 CLINICAL DATA:  Abdominal pain and bloating. EXAM: ABDOMEN - 2 VIEW COMPARISON:  Chest x-ray dated 03/31/2018 FINDINGS: There is a single minimally prominent loop of small bowel in the left mid abdomen. No significant bowel dilatation. There is air scattered throughout the bowel. No free air or free fluid. IUD in place. No acute bone abnormality. Surgical pins in the proximal left femur. Benign-appearing calcifications in the pelvis. IMPRESSION: Negative. Electronically Signed   By: JLorriane ShireM.D.   On: 04/22/2018 12:33   IMPRESSION AND PLAN:   A/P: 70F w/ Sz D/O, celiac Dx p/w AP, lipase elevation of unclear etiology. Mild transaminasemia. Hyperglycemia, hypomagnesemia. -AP, lipase elevation, mild transaminasemia: Lipase initially 440, decreased to 163. AST 89, ALT WNL, LDH WNL, Bili WNL. CT unimpressive. I am unable to arrive at an adequate explanation for pt's symptoms/pathology. GGT, LDH. IVF. Pain ctrl, antiemetics. Protonix. GI consult. Assistance appreciated. -Hypomagnesemia: Replete and monitor. -Hx Sz D/O: Lamictal level. c/w  home meds. Told pt not to take Tramadol in the future (alters seizure threshold). -c/w other home meds/formulary subs as tolerated. -FEN/GI: Gluten-free soft diet, ADAT. -DVT PPx: Lovenox. -Code status: Full code. -Disposition: Observation, < 2 midnights. Meets utilization review criteria for inpatient admission (Lipase 440), may change status if necessary/appropriate.   All the records are reviewed and case discussed with ED provider. Management plans discussed with the patient, family and they are in agreement.  CODE STATUS: Full code.  TOTAL TIME TAKING CARE OF THIS PATIENT: 75 minutes.    PArta SilenceM.D on 04/23/2018 at 2:52 AM  Between 7am to 6pm - Pager - [phone]  After 6pm go to [email or web address] - pProofreader [facility]  Office  [phone] CC: Primary care physician; Patient, No Pcp Per   Note: This dictation was prepared with Dragon dictation along with smaller phrase technology. Any transcriptional errors that result from this process are unintentional.

## 2018-04-23 NOTE — ED Notes (Signed)
ED TO INPATIENT HANDOFF REPORT  Name/Age/Gender Crystal Mckinney 43 y.o. female  Code Status   Home/SNF/Other Home  Chief Complaint Abdominal Pain  Level of Care/Admitting Diagnosis ED Disposition    ED Disposition Condition [place]: [place] [100120]  Level of Care: Med-Surg [16]  Diagnosis: Abdominal pain [742595]  Admitting Physician: Arta Silence [[id number]]  Attending Physician: Arta Silence [[id number]]  PT Class (Do Not Modify): Observation [104]  PT Acc Code (Do Not Modify): Observation [10022]       Medical History Past Medical History:  Diagnosis Date  . ADD (attention deficit disorder)   . Anxiety   . Seizures ([facility])     Allergies Allergies  Allergen Reactions  . Dilantin [Phenytoin] Anaphylaxis  . Penicillins Anaphylaxis    IV Location/Drains/Wounds Patient Lines/Drains/Airways Status   Active Line/Drains/Airways    Name:   Placement date:   Placement time:   Site:   Days:   Peripheral IV 04/22/18 Right Antecubital   04/22/18    1555    Antecubital   1          Labs/Imaging Results for orders placed or performed during the hospital encounter of 04/22/18 (from the past 48 hour(s))  Lipase, blood     Status: Abnormal   Collection Time: 04/22/18  3:55 PM  Result Value Ref Range   Lipase 163 (H) 11 - 51 U/L    Comment: Performed at [facility], [address] [place], [place] [address]  Comprehensive metabolic panel     Status: Abnormal   Collection Time: 04/22/18  3:55 PM  Result Value Ref Range   Sodium 133 (L) 135 - 145 mmol/L   Potassium 4.0 3.5 - 5.1 mmol/L   Chloride 101 98 - 111 mmol/L   CO2 22 22 - 32 mmol/L   Glucose, Bld 125 (H) 70 - 99 mg/dL   BUN 6 6 - 20 mg/dL   Creatinine, Ser 0.51 0.44 - 1.00 mg/dL   Calcium 9.6 8.9 - 10.3 mg/dL   Total Protein 7.8 6.5 - 8.1 g/dL   Albumin 4.5 3.5 - 5.0 g/dL   AST 89 (H) 15 - 41 U/L   ALT 30 0 - 44 U/L   Alkaline  Phosphatase 80 38 - 126 U/L   Total Bilirubin 0.9 0.3 - 1.2 mg/dL   GFR calc non Af Amer >60 >60 mL/min   GFR calc Af Amer >60 >60 mL/min   Anion gap 10 5 - 15    Comment: Performed at [facility], [address] [place], [place] [address]  CBC     Status: Abnormal   Collection Time: 04/22/18  3:55 PM  Result Value Ref Range   WBC 6.7 4.0 - 10.5 K/uL   RBC 3.59 (L) 3.87 - 5.11 MIL/uL   Hemoglobin 12.5 12.0 - 15.0 g/dL   HCT 35.3 (L) 36.0 - 46.0 %   MCV 98.3 80.0 - 100.0 fL   MCH 34.8 (H) 26.0 - 34.0 pg   MCHC 35.4 30.0 - 36.0 g/dL   RDW 11.2 (L) 11.5 - 15.5 %   Platelets 256 150 - 400 K/uL   nRBC 0.0 0.0 - 0.2 %    Comment: Performed at [facility], [address] [place], [place] [address]  Urinalysis, Complete w Microscopic     Status: Abnormal   Collection Time: 04/22/18  5:25 PM  Result Value Ref Range   Color, Urine YELLOW (A)  YELLOW   APPearance HAZY (A) CLEAR   Specific Gravity, Urine 1.023 1.005 - 1.030   pH 5.0 5.0 - 8.0   Glucose, UA NEGATIVE NEGATIVE mg/dL   Hgb urine dipstick NEGATIVE NEGATIVE   Bilirubin Urine NEGATIVE NEGATIVE   Ketones, ur 5 (A) NEGATIVE mg/dL   Protein, ur 30 (A) NEGATIVE mg/dL   Nitrite NEGATIVE NEGATIVE   Leukocytes,Ua NEGATIVE NEGATIVE   RBC / HPF 0-5 0 - 5 RBC/hpf   WBC, UA 0-5 0 - 5 WBC/hpf   Bacteria, UA NONE SEEN NONE SEEN   Squamous Epithelial / LPF 11-20 0 - 5   Mucus PRESENT     Comment: Performed at [facility], [address] [place], [place] [address]  Pregnancy, urine     Status: None   Collection Time: 04/22/18  5:25 PM  Result Value Ref Range   Preg Test, Ur NEGATIVE NEGATIVE    Comment: Performed at [facility], [address], [place], [place] [address]  Urine Drug Screen, Qualitative ([facility] only)     Status: Abnormal   Collection Time: 04/22/18  5:25 PM  Result Value Ref Range   Tricyclic, Ur Screen NONE DETECTED NONE DETECTED   Amphetamines, Ur Screen NONE DETECTED NONE  DETECTED   MDMA (Ecstasy)Ur Screen NONE DETECTED NONE DETECTED   Cocaine Metabolite,Ur [place] NONE DETECTED NONE DETECTED   Opiate, Ur Screen NONE DETECTED NONE DETECTED   Phencyclidine (PCP) Ur S NONE DETECTED NONE DETECTED   Cannabinoid 50 Ng, Ur [place] NONE DETECTED NONE DETECTED   Barbiturates, Ur Screen NONE DETECTED NONE DETECTED   Benzodiazepine, Ur Scrn POSITIVE (A) NONE DETECTED   Methadone Scn, Ur NONE DETECTED NONE DETECTED    Comment: (NOTE) Tricyclics + metabolites, urine    Cutoff 1000 ng/mL Amphetamines + metabolites, urine  Cutoff 1000 ng/mL MDMA (Ecstasy), urine              Cutoff 500 ng/mL Cocaine Metabolite, urine          Cutoff 300 ng/mL Opiate + metabolites, urine        Cutoff 300 ng/mL Phencyclidine (PCP), urine         Cutoff 25 ng/mL Cannabinoid, urine                 Cutoff 50 ng/mL Barbiturates + metabolites, urine  Cutoff 200 ng/mL Benzodiazepine, urine              Cutoff 200 ng/mL Methadone, urine                   Cutoff 300 ng/mL The urine drug screen provides only a preliminary, unconfirmed analytical test result and should not be used for non-medical purposes. Clinical consideration and professional judgment should be applied to any positive drug screen result due to possible interfering substances. A more specific alternate chemical method must be used in order to obtain a confirmed analytical result. Gas chromatography / mass spectrometry (GC/MS) is the preferred confirmat ory method. Performed at [facility], [address] [place], [place] [address]   Pregnancy, urine POC     Status: None   Collection Time: 04/22/18  5:29 PM  Result Value Ref Range   Preg Test, Ur NEGATIVE NEGATIVE    Comment:        THE SENSITIVITY OF THIS METHODOLOGY IS >24 mIU/mL    Dg Chest 2 View  Result Date: 04/22/2018 CLINICAL DATA:  Nausea, vomiting and weakness times 2-3 days. EXAM: CHEST -  2 VIEW COMPARISON:  03/31/2018 FINDINGS: The heart size and  mediastinal contours are within normal limits. Both lungs are clear. Redemonstration of now subacute right posterior eighth and ninth rib fractures with minimal development of callus involving the eighth rib. No pneumothorax. No pleural effusion. No new osseous abnormality. IMPRESSION: 1. No active cardiopulmonary disease. 2. Subacute right posterior eighth and ninth rib fractures. No pneumothorax. Electronically Signed   By: Ashley Royalty M.D.   On: 04/22/2018 18:27   Ct Abdomen Pelvis W Contrast  Result Date: 04/22/2018 CLINICAL DATA:  Left upper quadrant abdomen pain starting on Tuesday. EXAM: CT ABDOMEN AND PELVIS WITH CONTRAST TECHNIQUE: Multidetector CT imaging of the abdomen and pelvis was performed using the standard protocol following bolus administration of intravenous contrast. CONTRAST:  58m OMNIPAQUE IOHEXOL 300 MG/ML  SOLN COMPARISON:  None. FINDINGS: Lower chest: No acute abnormality. Hepatobiliary: Diffuse low density of the liver is identified. There is no focal liver lesion. The gallbladder is normal. The biliary tree is normal. Pancreas: Unremarkable. No pancreatic ductal dilatation or surrounding inflammatory changes. Spleen: Normal in size without focal abnormality. Adrenals/Urinary Tract: Adrenal glands are unremarkable. Kidneys are normal, without renal calculi, focal lesion, or hydronephrosis. Bladder is unremarkable. Stomach/Bowel: There is no small bowel obstruction. The stomach is normal. The appendix is not seen but no inflammation is noted around cecum. There is no evidence of diverticulitis. In the cecum, there is 2.3 x 4.4 cm soft tissue density. Vascular/Lymphatic: No significant vascular findings are present. No enlarged abdominal or pelvic lymph nodes. Reproductive: Uterus and bilateral adnexa are unremarkable. IUD is identified in the uterus. Other: None. Musculoskeletal: No acute or significant osseous findings. IMPRESSION: No acute abnormality identified in the abdomen and  pelvis. 4.4 cm soft tissue density identified in the cecum. Although this may be in part due to intra bowel content, underlying mass is not excluded. Recommend barium enema on outpatient basis for further evaluation. Fatty infiltration of liver. Electronically Signed   By: WAbelardo DieselM.D.   On: 04/22/2018 19:35   Dg Abd 2 Views  Result Date: 04/22/2018 CLINICAL DATA:  Abdominal pain and bloating. EXAM: ABDOMEN - 2 VIEW COMPARISON:  Chest x-ray dated 03/31/2018 FINDINGS: There is a single minimally prominent loop of small bowel in the left mid abdomen. No significant bowel dilatation. There is air scattered throughout the bowel. No free air or free fluid. IUD in place. No acute bone abnormality. Surgical pins in the proximal left femur. Benign-appearing calcifications in the pelvis. IMPRESSION: Negative. Electronically Signed   By: JLorriane ShireM.D.   On: 04/22/2018 12:33    Pending Labs Unresulted Labs (From admission, onward)    Start     Ordered   04/23/18 0028  Gamma GT  Add-on,   AD     04/23/18 0027   04/23/18 0028  Lactate dehydrogenase  Add-on,   AD     04/23/18 0027   04/23/18 0025  Ethanol  Once,   R     04/23/18 0025   04/23/18 0025  Gamma GT  Once,   R     04/23/18 0025   04/23/18 0025  Lactate dehydrogenase  Once,   R     04/23/18 0025   04/22/18 2327  Lamotrigine level  Add-on,   AD     04/22/18 2326   04/22/18 2326  Ethanol  Add-on,   AD     04/22/18 2325   04/22/18 2323  Magnesium  Add-on,   AD  Question:  Specimen collection method  Answer:  Unit=Unit collect   04/22/18 2322   04/22/18 2323  Phosphorus  Add-on,   AD    Question:  Specimen collection method  Answer:  Unit=Unit collect   04/22/18 2322   Signed and Held  HIV antibody (Routine Testing)  Once,   R     Signed and Held   Signed and Held  Basic metabolic panel  Tomorrow morning,   R     Signed and Held          Vitals/Pain Today's Vitals   04/22/18 2345 04/23/18 0000 04/23/18 0015 04/23/18 0030   BP:  (!) 132/98  122/86  Pulse: 72 69 73 72  Resp:      Temp:      TempSrc:      SpO2: 100% 100% 99% 100%  PainSc:        Isolation Precautions No active isolations  Medications Medications  HYDROmorphone (DILAUDID) injection 0.5 mg (has no administration in time range)  lactated ringers infusion (has no administration in time range)  sodium chloride flush (NS) 0.9 % injection 3 mL (3 mLs Intravenous Given 04/22/18 1941)  iopamidol (ISOVUE-300) 61 % injection 30 mL (30 mLs Oral Contrast Given 04/22/18 1729)  morphine 4 MG/ML injection 4 mg (4 mg Intravenous Given 04/22/18 1737)  ondansetron (ZOFRAN) injection 4 mg (4 mg Intravenous Given 04/22/18 1735)  iohexol (OMNIPAQUE) 300 MG/ML solution 75 mL (75 mLs Intravenous Contrast Given 04/22/18 1856)  morphine 4 MG/ML injection 4 mg (4 mg Intravenous Given 04/22/18 1940)  diphenhydrAMINE (BENADRYL) injection 25 mg (25 mg Intravenous Given 04/22/18 2112)  sodium chloride 0.9 % bolus 1,000 mL (0 mLs Intravenous Stopped 04/22/18 2306)    Mobility walks

## 2018-04-24 MED ORDER — OMEPRAZOLE 20 MG PO CPDR: 20.0000 mg | DELAYED_RELEASE_CAPSULE | Freq: Every day | ORAL | 0 refills | Status: DC

## 2018-04-24 MED ORDER — ONDANSETRON HCL 4 MG PO TABS: 4.0000 mg | ORAL_TABLET | Freq: Four times a day (QID) | ORAL | 0 refills | Status: DC | PRN

## 2018-04-24 NOTE — Progress Notes (Signed)
Patient discharge teaching given, including activity, diet, follow-up appoints, and medications. Patient verbalized understanding of all discharge instructions. IV access was d/c'd. Vitals are stable. Skin is intact except as charted in most recent assessments. Pt to be escorted out by RN, to be driven home by family.  Andrzej Scully [employer]

## 2018-04-24 NOTE — Discharge Summary (Signed)
Crystal Mckinney, is a 43 y.o. female  DOB [birth year]  MRN [id number].  Admission date:  04/22/2018  Admitting Physician  Arta Silence, MD  Discharge Date:  04/24/2018   Primary MD  Patient, No Pcp Per  Recommendations for primary care physician for things to follow:  Follow-up with [facility] primary care as needed Follow-up with Dr. Alice Reichert in 2 weeks.    Admission Diagnosis  Acute pancreatitis, unspecified complication status, unspecified pancreatitis type [K85.90]   Discharge Diagnosis  Acute pancreatitis, unspecified complication status, unspecified pancreatitis type [K85.90]    Active Problems:   Abdominal pain      Past Medical History:  Diagnosis Date  . ADD (attention deficit disorder)   . Anxiety   . Celiac disease   . Seizures ([place])     Past Surgical History:  Procedure Laterality Date  . HIP SURGERY Left        History of present illness and  Hospital Course:     Kindly see H&P for history of present illness and admission details, please review complete Labs, Consult reports and Test reports for all details in brief  HPI  from the history and physical done on the day of admission 43 year old female patient admitted because of fever, abdominal pain, nausea, vomiting, diarrhea, admitted for the same.   Hospital Course  #1. acute abdominal pain, acute pancreatitis: Patient lipase was 163 on admission, normal LFTs, his labs were essentially normal with normal CBC, normal Chem-7, CT abdomen on admission did not show any acute pancreatitis lesions but she felt better with IV fluids, IV pain medicines, IV nausea medicines, patient lipase elevation is nonspecific likely idiopathic pancreatitis improved today, discharge home today requested nausea medicine prescription and also reflux medicine, so we give  prescription for Zofran, Prilosec.   #2 ,acute diarrhea, stool C. difficile have been negative, required IV fluids, IV nausea medicines, patient has a little bit abdominal pain but much better than yesterday, has history of celiac sprue disease, follows up with gastroenterology at [facility].  She told me that she had 3 colonoscopies till now.  3.  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 Nathanial Rancher as an outpatient in 2 weeks regarding colonoscopy. 4.  History of anxiety, seizure disorder: Continue home medicines.   Discharge Condition: Stable   Follow UP      Discharge Instructions  and  Discharge Medications   Follow-up with gastroenterology dermatology in 2 weeks regarding colonoscopy for possible cecal mass evaluation that was found on CT abdomen, pelvis with contrast on admission.  Allergies as of 04/24/2018      Reactions   Dilantin [phenytoin] Anaphylaxis   Penicillins Anaphylaxis      Medication List    TAKE these medications   escitalopram 20 MG tablet Commonly known as:  LEXAPRO Take 1 tablet by mouth daily.   lamoTRIgine 100 MG tablet Commonly known as:  LAMICTAL Take 200 mg by mouth 2 (two) times daily.   levETIRAcetam 500 MG tablet Commonly known as:  KEPPRA Take 1,500 mg by mouth 2 (two) times daily.   LORazepam 1 MG tablet Commonly known as:  ATIVAN Take 1 mg by mouth 2 (two) times daily as needed for anxiety.   omeprazole 20 MG capsule Commonly known as:  PRILOSEC Take 1 capsule (20 mg total) by mouth daily.   ondansetron 4 MG tablet Commonly known as:  ZOFRAN Take 1 tablet (4 mg total) by mouth every 6 (six) hours as needed for nausea.  traMADol 50 MG tablet Commonly known as:  ULTRAM Take 1 tablet (50 mg total) by mouth every 6 (six) hours as needed.         Diet and Activity recommendation: See Discharge Instructions above   Consults obtained -gastroenterology   Major procedures and Radiology Reports - PLEASE review detailed and  final reports for all details, in brief -      Dg Chest 2 View  Result Date: 04/22/2018 CLINICAL DATA:  Nausea, vomiting and weakness times 2-3 days. EXAM: CHEST - 2 VIEW COMPARISON:  03/31/2018 FINDINGS: The heart size and mediastinal contours are within normal limits. Both lungs are clear. Redemonstration of now subacute right posterior eighth and ninth rib fractures with minimal development of callus involving the eighth rib. No pneumothorax. No pleural effusion. No new osseous abnormality. IMPRESSION: 1. No active cardiopulmonary disease. 2. Subacute right posterior eighth and ninth rib fractures. No pneumothorax. Electronically Signed   By: Ashley Royalty M.D.   On: 04/22/2018 18:27   Dg Ribs Unilateral W/chest Right  Result Date: 03/31/2018 CLINICAL DATA:  43 year old female with a history of right posterior rib pain and bruising from a fall EXAM: RIGHT RIBS AND CHEST - 3+ VIEW COMPARISON:  None. FINDINGS: Cardiomediastinal silhouette within normal limits. No evidence of central vascular congestion. No pneumothorax or pleural effusion. Minimally displaced posterior right eighth rib fracture. Nondisplaced posterior right ninth rib fracture IMPRESSION: Negative for acute cardiopulmonary disease. Posterior right eighth and ninth rib fractures. Electronically Signed   By: Corrie Mckusick D.O.   On: 03/31/2018 14:42   Ct Abdomen Pelvis W Contrast  Result Date: 04/22/2018 CLINICAL DATA:  Left upper quadrant abdomen pain starting on Tuesday. EXAM: CT ABDOMEN AND PELVIS WITH CONTRAST TECHNIQUE: Multidetector CT imaging of the abdomen and pelvis was performed using the standard protocol following bolus administration of intravenous contrast. CONTRAST:  66m OMNIPAQUE IOHEXOL 300 MG/ML  SOLN COMPARISON:  None. FINDINGS: Lower chest: No acute abnormality. Hepatobiliary: Diffuse low density of the liver is identified. There is no focal liver lesion. The gallbladder is normal. The biliary tree is normal.  Pancreas: Unremarkable. No pancreatic ductal dilatation or surrounding inflammatory changes. Spleen: Normal in size without focal abnormality. Adrenals/Urinary Tract: Adrenal glands are unremarkable. Kidneys are normal, without renal calculi, focal lesion, or hydronephrosis. Bladder is unremarkable. Stomach/Bowel: There is no small bowel obstruction. The stomach is normal. The appendix is not seen but no inflammation is noted around cecum. There is no evidence of diverticulitis. In the cecum, there is 2.3 x 4.4 cm soft tissue density. Vascular/Lymphatic: No significant vascular findings are present. No enlarged abdominal or pelvic lymph nodes. Reproductive: Uterus and bilateral adnexa are unremarkable. IUD is identified in the uterus. Other: None. Musculoskeletal: No acute or significant osseous findings. IMPRESSION: No acute abnormality identified in the abdomen and pelvis. 4.4 cm soft tissue density identified in the cecum. Although this may be in part due to intra bowel content, underlying mass is not excluded. Recommend barium enema on outpatient basis for further evaluation. Fatty infiltration of liver. Electronically Signed   By: WAbelardo DieselM.D.   On: 04/22/2018 19:35   Dg Abd 2 Views  Result Date: 04/22/2018 CLINICAL DATA:  Abdominal pain and bloating. EXAM: ABDOMEN - 2 VIEW COMPARISON:  Chest x-ray dated 03/31/2018 FINDINGS: There is a single minimally prominent loop of small bowel in the left mid abdomen. No significant bowel dilatation. There is air scattered throughout the bowel. No free air or free fluid. IUD in place.  No acute bone abnormality. Surgical pins in the proximal left femur. Benign-appearing calcifications in the pelvis. IMPRESSION: Negative. Electronically Signed   By: Lorriane Shire M.D.   On: 04/22/2018 12:33    Micro Results     Recent Results (from the past 240 hour(s))  C difficile quick scan w PCR reflex     Status: None   Collection Time: 04/23/18 12:00 PM  Result  Value Ref Range Status   C Diff antigen NEGATIVE NEGATIVE Final   C Diff toxin NEGATIVE NEGATIVE Final   C Diff interpretation No C. difficile detected.  Final    Comment: Performed at [facility], [address] [place], [place] [address]       Today   Subjective:   Herta Hink today has no headache,no chest abdominal pain,no new weakness tingling or numbness, feels much better wants to go home today.   Objective:   Blood pressure 108/69, pulse 70, temperature 98.4 F (36.9 C), temperature source Oral, resp. rate 20, height 5' 5"  (1.651 m), weight 56.6 kg, last menstrual period 04/12/2018, SpO2 99 %.   Intake/Output Summary (Last 24 hours) at 04/24/2018 0915 Last data filed at 04/24/2018 0528 Gross per 24 hour  Intake 1358.89 ml  Output 200 ml  Net 1158.89 ml    Exam Awake Alert, Oriented x 3, No new F.N deficits, Normal affect [place].AT,PERRAL Supple Neck,No JVD, No cervical lymphadenopathy appriciated.  Symmetrical Chest wall movement, Good air movement bilaterally, CTAB RRR,No Gallops,Rubs or new Murmurs, No Parasternal Heave +ve B.Sounds, Abd Soft, Non tender, No organomegaly appriciated, No rebound -guarding or rigidity. No Cyanosis, Clubbing or edema, No new Rash or bruise  Data Review   CBC w Diff:  Lab Results  Component Value Date   WBC 6.7 04/22/2018   HGB 12.5 04/22/2018   HCT 35.3 (L) 04/22/2018   PLT 256 04/22/2018   LYMPHOPCT 13 04/22/2018   MONOPCT 13 04/22/2018   EOSPCT 1 04/22/2018   BASOPCT 0 04/22/2018    CMP:  Lab Results  Component Value Date   NA 139 04/23/2018   K 3.9 04/23/2018   CL 108 04/23/2018   CO2 26 04/23/2018   BUN <5 (L) 04/23/2018   CREATININE 0.45 04/23/2018   PROT 7.8 04/22/2018   ALBUMIN 4.5 04/22/2018   BILITOT 0.9 04/22/2018   ALKPHOS 80 04/22/2018   AST 89 (H) 04/22/2018   ALT 30 04/22/2018  .   Total Time in preparing paper work, data evaluation and todays exam - 35 minutes  Epifanio Lesches  M.D on 04/24/2018 at 9:15 AM    Note: This dictation was prepared with Dragon dictation along with smaller phrase technology. Any transcriptional errors that result from this process are unintentional.

## 2018-04-25 LAB — LAMOTRIGINE LEVEL: Lamotrigine Lvl: 14.4 ug/mL (ref 2.0–20.0)

## 2018-04-25 LAB — HIV ANTIBODY (ROUTINE TESTING W REFLEX): HIV Screen 4th Generation wRfx: NONREACTIVE

## 2018-10-16 ENCOUNTER — Other Ambulatory Visit: Payer: Self-pay

## 2018-10-16 ENCOUNTER — Emergency Department
Admission: EM | Admit: 2018-10-16 | Discharge: 2018-10-17 | Disposition: A | Discharge status: Home / Self Care | Payer: BLUE CROSS/BLUE SHIELD | Attending: Emergency Medicine | Admitting: Emergency Medicine

## 2018-10-16 ENCOUNTER — Emergency Department: Payer: BLUE CROSS/BLUE SHIELD

## 2018-10-16 DIAGNOSIS — R197 Diarrhea, unspecified: Secondary | ICD-10-CM | POA: Diagnosis not present

## 2018-10-16 DIAGNOSIS — Z20828 Contact with and (suspected) exposure to other viral communicable diseases: Secondary | ICD-10-CM | POA: Diagnosis not present

## 2018-10-16 DIAGNOSIS — R112 Nausea with vomiting, unspecified: Secondary | ICD-10-CM | POA: Diagnosis not present

## 2018-10-16 DIAGNOSIS — Z880 Allergy status to penicillin: Secondary | ICD-10-CM | POA: Insufficient documentation

## 2018-10-16 DIAGNOSIS — Z79899 Other long term (current) drug therapy: Secondary | ICD-10-CM | POA: Diagnosis not present

## 2018-10-16 LAB — CBC
HCT: 34.2 % — ABNORMAL LOW (ref 36.0–46.0)
Hemoglobin: 12.2 g/dL (ref 12.0–15.0)
MCH: 36.1 pg — ABNORMAL HIGH (ref 26.0–34.0)
MCHC: 35.7 g/dL (ref 30.0–36.0)
MCV: 101.2 fL — ABNORMAL HIGH (ref 80.0–100.0)
Platelets: 170 10*3/uL (ref 150–400)
RBC: 3.38 MIL/uL — ABNORMAL LOW (ref 3.87–5.11)
RDW: 11.9 % (ref 11.5–15.5)
WBC: 5.2 10*3/uL (ref 4.0–10.5)
nRBC: 0 % (ref 0.0–0.2)

## 2018-10-16 LAB — COMPREHENSIVE METABOLIC PANEL
ALT: 38 U/L (ref 0–44)
AST: 67 U/L — ABNORMAL HIGH (ref 15–41)
Albumin: 4.8 g/dL (ref 3.5–5.0)
Alkaline Phosphatase: 121 U/L (ref 38–126)
Anion gap: 18 — ABNORMAL HIGH (ref 5–15)
BUN: 7 mg/dL (ref 6–20)
CO2: 19 mmol/L — ABNORMAL LOW (ref 22–32)
Calcium: 9.4 mg/dL (ref 8.9–10.3)
Chloride: 96 mmol/L — ABNORMAL LOW (ref 98–111)
Creatinine, Ser: 0.54 mg/dL (ref 0.44–1.00)
GFR calc Af Amer: >60 mL/min (ref >60)
GFR calc non Af Amer: >60 mL/min (ref >60)
Glucose, Bld: 99 mg/dL (ref 70–99)
Potassium: 3.4 mmol/L — ABNORMAL LOW (ref 3.5–5.1)
Sodium: 133 mmol/L — ABNORMAL LOW (ref 135–145)
Total Bilirubin: 1 mg/dL (ref 0.3–1.2)
Total Protein: 8.1 g/dL (ref 6.5–8.1)

## 2018-10-16 MED ORDER — LEVETIRACETAM IN NACL 1500 MG/100ML IV SOLN
1500.0000 mg | INTRAVENOUS | Status: AC
  Administered 2018-10-17: 1500 mg via INTRAVENOUS
  Filled 2018-10-16: qty 100

## 2018-10-16 MED ORDER — SODIUM CHLORIDE 0.9 % IV BOLUS
1000.0000 mL | Freq: Once | INTRAVENOUS | Status: AC
  Administered 2018-10-17: 1000 mL via INTRAVENOUS

## 2018-10-16 MED ORDER — ONDANSETRON HCL 4 MG/2ML IJ SOLN
4.0000 mg | INTRAMUSCULAR | Status: AC
  Administered 2018-10-17: 4 mg via INTRAVENOUS
  Filled 2018-10-16: qty 2

## 2018-10-16 NOTE — ED Triage Notes (Signed)
Pt states wet cough since Thursday, runny nose. Pt states last night at 1900 she began to experience vomiting and diarrhea without abd pain. Pt states she is concerned because she is not able to hold her seizure medication down.

## 2018-10-16 NOTE — ED Notes (Signed)
Pt reports N/Vomiting and hx of unable to keep down

## 2018-10-17 LAB — SARS CORONAVIRUS 2 (TAT 6-24 HRS): SARS Coronavirus 2: NEGATIVE

## 2018-10-17 MED ORDER — DEXTROSE-NACL 5-0.9 % IV SOLN
1000.0000 mL | Freq: Once | INTRAVENOUS | Status: AC
  Administered 2018-10-17: 02:00:00 1000 mL via INTRAVENOUS

## 2018-10-17 MED ORDER — LAMOTRIGINE 100 MG PO TABS: 200.0000 mg | ORAL_TABLET | ORAL | Status: DC

## 2018-10-17 MED ORDER — ONDANSETRON 4 MG PO TBDP: ORAL_TABLET | ORAL | 0 refills | Status: DC

## 2018-10-17 NOTE — ED Notes (Signed)
Peripheral IV discontinued. Catheter intact. No signs of infiltration or redness. Gauze applied to IV site.   Discharge instructions reviewed with patient. Questions fielded by this RN. Patient verbalizes understanding of instructions. Patient discharged home in stable condition per forbach. No acute distress noted at time of discharge.

## 2018-10-17 NOTE — ED Provider Notes (Signed)
[facility] Emergency Department Provider Note  ____________________________________________   First MD Initiated Contact with Patient 10/16/18 2311     (approximate)  I have reviewed the triage vital signs and the nursing notes.   HISTORY  Chief Complaint Emesis, Diarrhea, and Cough    HPI Crystal Mckinney is a 43 y.o. female with medical history as listed below who presents for evaluation of over 24 hours of persistent nausea and multiple episodes of vomiting.  She also reports having a cough for a few days and a runny nose and nasal congestion.  She has had a temperature as high as 100.9 at home.  She is most concerned about the nausea and vomiting because she has a seizure disorder and takes 2 seizure medicines twice a day.  She has not been able to keep down a dose since early yesterday morning.  She denies any known contact with COVID-19 patients.  She is not having any shortness of breath, just a mild but occasionally productive cough.  She denies sore throat, loss of smell or taste, shortness of breath, chest pain, abdominal pain, and dysuria.  She is starting to get a little bit sore from the vomiting but states that she is not having any abdominal pain.  She is also having diarrhea which is not uncommon for her because of her celiac disease but she has been careful with her diet recently.  None of her family members have been ill.  She had no suspicious food exposures that she can remember.  She reports the symptoms are severe and she feels dehydrated.  Nothing in particular makes her symptoms better and is worse every time she tries to eat or drink anything.        Past Medical History:  Diagnosis Date   ADD (attention deficit disorder)    Anxiety    Celiac disease    Seizures ([place])     Patient Active Problem List   Diagnosis Date Noted   Abdominal pain 04/22/2018    Past Surgical History:  Procedure Laterality Date   HIP SURGERY Left      Prior to Admission medications   Medication Sig Start Date End Date Taking? Authorizing Provider  escitalopram (LEXAPRO) 20 MG tablet Take 1 tablet by mouth daily. 02/28/18   [provider]  lamoTRIgine (LAMICTAL) 100 MG tablet Take 200 mg by mouth 2 (two) times daily.  01/15/18 01/15/19  [provider]  levETIRAcetam (KEPPRA) 500 MG tablet Take 1,500 mg by mouth 2 (two) times daily.  01/15/18 01/15/19  [provider]  LORazepam (ATIVAN) 1 MG tablet Take 1 mg by mouth 2 (two) times daily as needed for anxiety.  03/17/18   [provider]  omeprazole (PRILOSEC) 20 MG capsule Take 1 capsule (20 mg total) by mouth daily. 04/24/18 04/24/19  Epifanio Lesches, MD  ondansetron (ZOFRAN ODT) 4 MG disintegrating tablet Allow 1-2 tablets to dissolve in your mouth every 8 hours as needed for nausea/vomiting 10/17/18   Hinda Kehr, MD  ondansetron (ZOFRAN) 4 MG tablet Take 1 tablet (4 mg total) by mouth every 6 (six) hours as needed for nausea. 04/24/18   Epifanio Lesches, MD  traMADol (ULTRAM) 50 MG tablet Take 1 tablet (50 mg total) by mouth every 6 (six) hours as needed. 03/31/18   Lorin Picket, PA-C    Allergies Dilantin [phenytoin], Penicillins, and Gluten meal  Family History  Problem Relation Age of Onset   Healthy Mother  Hyperlipidemia Father     Social History Social History   Tobacco Use   Smoking status: Never Smoker   Smokeless tobacco: Never Used  Substance Use Topics   Alcohol use: Yes    Comment: social   Drug use: Never    Review of Systems Constitutional: Fever up to 100.9 over the last couple of days Eyes: No visual changes. ENT: No sore throat.  Nasal congestion and runny nose. Cardiovascular: Denies chest pain. Respiratory: Denies shortness of breath.  Mild productive cough. Gastrointestinal: Persistent nausea, vomiting, and diarrhea over 24+ hours.  No abdominal pain.   Genitourinary: Negative for  dysuria. Musculoskeletal: Negative for neck pain.  Negative for back pain. Integumentary: Negative for rash. Neurological: Negative for headaches, focal weakness or numbness.   ____________________________________________   PHYSICAL EXAM:  VITAL SIGNS: ED Triage Vitals  Enc Vitals Group     BP 10/16/18 2031 (!) 165/91     Pulse Rate 10/16/18 2031 90     Resp 10/16/18 2031 20     Temp 10/16/18 2031 98.7 F (37.1 C)     Temp Source 10/16/18 2031 Oral     SpO2 10/16/18 2031 100 %     Weight 10/16/18 2032 56.7 kg (125 lb)     Height 10/16/18 2032 1.651 m (5' 5" )     Head Circumference --      Peak Flow --      Pain Score 10/16/18 2032 0     Pain Loc --      Pain Edu? --      Excl. in [place]? --     Constitutional: Alert and oriented.  Generally well-appearing and in no distress at this time. Eyes: Conjunctivae are normal.  Head: Atraumatic. Nose: No congestion/rhinnorhea. Mouth/Throat: Mucous membranes are a little bit dry. Neck: No stridor.  No meningeal signs.   Cardiovascular: Normal rate, regular rhythm. Good peripheral circulation. Grossly normal heart sounds. Respiratory: Normal respiratory effort.  No retractions. Gastrointestinal: Soft and nontender. No distention.  Musculoskeletal: No lower extremity tenderness nor edema. No gross deformities of extremities. Neurologic:  Normal speech and language. No gross focal neurologic deficits are appreciated.  Skin:  Skin is warm, dry and intact. Psychiatric: Mood and affect are normal. Speech and behavior are normal.  ____________________________________________   LABS (all labs ordered are listed, but only abnormal results are displayed)  Labs Reviewed  COMPREHENSIVE METABOLIC PANEL - Abnormal; Notable for the following components:      Result Value   Sodium 133 (*)    Potassium 3.4 (*)    Chloride 96 (*)    CO2 19 (*)    AST 67 (*)    Anion gap 18 (*)    All other components within normal limits  CBC - Abnormal;  Notable for the following components:   RBC 3.38 (*)    HCT 34.2 (*)    MCV 101.2 (*)    MCH 36.1 (*)    All other components within normal limits  SARS CORONAVIRUS 2  URINALYSIS, COMPLETE (UACMP) WITH MICROSCOPIC  POC URINE PREG, ED   ____________________________________________  EKG  none - EKG not ordered by ED physician ____________________________________________  RADIOLOGY Ursula Alert, personally viewed and evaluated these images (plain radiographs) as part of my medical decision making, as well as reviewing the written report by the radiologist.  ED MD interpretation: No acute abnormalities on chest x-ray  Official radiology report(s): Dg Chest Portable 1 View  Result Date: 10/16/2018 CLINICAL DATA:  Productive cough EXAM: PORTABLE CHEST 1 VIEW COMPARISON:  April 22, 2018 FINDINGS: There are old right-sided rib fractures. The heart size is stable. There is no pneumothorax or large pleural effusion. There is no focal infiltrate. There is no acute osseous abnormality. IMPRESSION: No active disease. Electronically Signed   By: Constance Holster M.D.   On: 10/16/2018 23:55    ____________________________________________   PROCEDURES   Procedure(s) performed (including Critical Care):  Procedures   ____________________________________________   INITIAL IMPRESSION / MDM / [place] / ED COURSE  As part of my medical decision making, I reviewed the following data within the [place] notes reviewed and incorporated, Labs reviewed , Old chart reviewed, Radiograph reviewed  and Notes from prior ED visits   Differential diagnosis includes, but is not limited to, viral gastroenteritis, preformed foodborne pathogen, bacterial gastroenteritis, other nonspecific acute intra-abdominal infection, complication of celiac disease, IBD, IBS, COVID-19.  The patient is low risk for COVID-19 at this time but she does have some of the symptoms.   However she is well-appearing and in no distress.  Labs suggest some mild volume depletion and electrolyte abnormalities consistent with volume depletion and GI illness.  Her anion gap is slightly elevated but there is no evidence of DKA.  She has not yet provided urine specimen but I suspect she will have a little bit of ketonuria.  I am giving a liter of normal saline, Zofran 4 mg IV, and given that she has not been able to take her usual seizure medicines (which includes Keppra 1500 mg p.o. twice daily and Lamictal 200 mg p.o. twice daily), I discussed it with her and we decided to give her an IV dose of Keppra 1500 mg to try and tide her over.  I verified with pharmacy that a IV formulation for Lamictal does not exist.  I will reassess the patient's nausea level and give her a dose of oral Lamictal before she is discharged assuming she can go home; the patient does want to go home if possible.  I anticipate swabbing for COVID-19 before she leaves regardless but at this point there is not an indication for emergent testing.      Clinical Course as of Oct 17 231  Mon Oct 17, 2018  0001 No acute abnormalities on chest x-ray  DG Chest Portable 1 View [CF]  [phone] The patient is feeling much better and feels like she will be able to go home but she feels like a little bit more fluid might help.  Given her volume losses I think that is appropriate, and this time I am going to give her D5 normal saline since she has not been able to eat very much over the last couple of days.  She was able to urinate but did not catch the specimen so I cannot check for ketonuria but I feel the D5 normal saline is appropriate.  She is ready for discharge after the fluids complete.  I have given her prescription for Zofran and follow-up recommendations as well as return precautions.   [CF]    Clinical Course User Index [CF] Hinda Kehr, MD     ____________________________________________  FINAL CLINICAL IMPRESSION(S)  / ED DIAGNOSES  Final diagnoses:  Nausea vomiting and diarrhea     MEDICATIONS GIVEN DURING THIS VISIT:  Medications  sodium chloride 0.9 % bolus 1,000 mL (0 mLs Intravenous Stopped 10/17/18 0145)  ondansetron (ZOFRAN) injection 4 mg (4 mg Intravenous Given 10/17/18 0015)  levETIRAcetam (KEPPRA) IVPB 1500 mg/ 100 mL premix (0 mg Intravenous Stopped 10/17/18 0026)  dextrose 5 %-0.9 % sodium chloride infusion (1,000 mLs Intravenous New Bag/Given 10/17/18 0214)     ED Discharge Orders         Ordered    ondansetron (ZOFRAN ODT) 4 MG disintegrating tablet     10/17/18 0215          *Please note:  Crystal Mckinney was evaluated in Emergency Department on 10/17/2018 for the symptoms described in the history of present illness. She was evaluated in the context of the global COVID-19 pandemic, which necessitated consideration that the patient might be at risk for infection with the SARS-CoV-2 virus that causes COVID-19. Institutional protocols and algorithms that pertain to the evaluation of patients at risk for COVID-19 are in a state of rapid change based on information released by regulatory bodies including the CDC and federal and state organizations. These policies and algorithms were followed during the patient's care in the ED.  Some ED evaluations and interventions may be delayed as a result of limited staffing during the pandemic.*  Note:  This document was prepared using Dragon voice recognition software and may include unintentional dictation errors.   Hinda Kehr, MD 10/17/18 [phone]

## 2018-10-17 NOTE — ED Notes (Signed)
Answered pt's call bell; pt had gotten up to the toilet in room and was having trouble getting her blood pressure cuff back in correct position; pt asking about disposition; will discuss with MD and inform pt of same

## 2018-10-17 NOTE — Discharge Instructions (Signed)
We believe your symptoms are caused by either a viral infection or possible a bad food exposure.  Either way, since your symptoms have improved, we feel it is safe for you to go home and follow up with your regular doctor.  Please read the included information and stick to a bland diet for the next two days.  Drink plenty of clear fluids, and if you were provided with a prescription, please take it according to the label instructions.  We also checked a coronavirus swab and you should be able to log into MyChart and see a result within about 48 hours at the most.  Please be sure to isolate yourself until then on the off chance that your symptoms are the result of COVID-19.  If you develop any new or worsening symptoms, including persistent vomiting not controlled with medication, fever greater than 101, severe or worsening abdominal pain, or other symptoms that concern you, please return immediately to the Emergency Department.

## 2018-12-11 ENCOUNTER — Encounter: Payer: Self-pay | Admitting: Emergency Medicine

## 2018-12-11 ENCOUNTER — Other Ambulatory Visit: Payer: Self-pay

## 2018-12-11 ENCOUNTER — Emergency Department
Admission: EM | Admit: 2018-12-11 | Discharge: 2018-12-12 | Disposition: A | Discharge status: Home / Self Care | Payer: BLUE CROSS/BLUE SHIELD | Attending: Emergency Medicine | Admitting: Emergency Medicine

## 2018-12-11 ENCOUNTER — Emergency Department: Payer: BLUE CROSS/BLUE SHIELD

## 2018-12-11 DIAGNOSIS — Z79899 Other long term (current) drug therapy: Secondary | ICD-10-CM | POA: Insufficient documentation

## 2018-12-11 DIAGNOSIS — R519 Headache, unspecified: Secondary | ICD-10-CM | POA: Insufficient documentation

## 2018-12-11 DIAGNOSIS — R202 Paresthesia of skin: Secondary | ICD-10-CM | POA: Insufficient documentation

## 2018-12-11 DIAGNOSIS — R29898 Other symptoms and signs involving the musculoskeletal system: Secondary | ICD-10-CM | POA: Diagnosis not present

## 2018-12-11 LAB — URINALYSIS, COMPLETE (UACMP) WITH MICROSCOPIC
Bacteria, UA: NONE SEEN
Bilirubin Urine: NEGATIVE
Glucose, UA: NEGATIVE mg/dL
Hgb urine dipstick: NEGATIVE
Ketones, ur: 20 mg/dL — AB
Leukocytes,Ua: NEGATIVE
Nitrite: NEGATIVE
Protein, ur: NEGATIVE mg/dL
Specific Gravity, Urine: 1.015 (ref 1.005–1.030)
pH: 6 (ref 5.0–8.0)

## 2018-12-11 LAB — CBC WITH DIFFERENTIAL/PLATELET
Abs Immature Granulocytes: 0.01 10*3/uL (ref 0.00–0.07)
Basophils Absolute: 0 10*3/uL (ref 0.0–0.1)
Basophils Relative: 0 %
Eosinophils Absolute: 0 10*3/uL (ref 0.0–0.5)
Eosinophils Relative: 1 %
HCT: 34.5 % — ABNORMAL LOW (ref 36.0–46.0)
Hemoglobin: 12.5 g/dL (ref 12.0–15.0)
Immature Granulocytes: 0 %
Lymphocytes Relative: 19 %
Lymphs Abs: 1 10*3/uL (ref 0.7–4.0)
MCH: 36.8 pg — ABNORMAL HIGH (ref 26.0–34.0)
MCHC: 36.2 g/dL — ABNORMAL HIGH (ref 30.0–36.0)
MCV: 101.5 fL — ABNORMAL HIGH (ref 80.0–100.0)
Monocytes Absolute: 0.5 10*3/uL (ref 0.1–1.0)
Monocytes Relative: 10 %
Neutro Abs: 3.6 10*3/uL (ref 1.7–7.7)
Neutrophils Relative %: 70 %
Platelets: 280 10*3/uL (ref 150–400)
RBC: 3.4 MIL/uL — ABNORMAL LOW (ref 3.87–5.11)
RDW: 12.1 % (ref 11.5–15.5)
WBC: 5.2 10*3/uL (ref 4.0–10.5)
nRBC: 0 % (ref 0.0–0.2)

## 2018-12-11 LAB — BASIC METABOLIC PANEL
Anion gap: 16 — ABNORMAL HIGH (ref 5–15)
BUN: 5 mg/dL — ABNORMAL LOW (ref 6–20)
CO2: 22 mmol/L (ref 22–32)
Calcium: 9.5 mg/dL (ref 8.9–10.3)
Chloride: 92 mmol/L — ABNORMAL LOW (ref 98–111)
Creatinine, Ser: 0.53 mg/dL (ref 0.44–1.00)
GFR calc Af Amer: >60 mL/min (ref >60)
GFR calc non Af Amer: >60 mL/min (ref >60)
Glucose, Bld: 96 mg/dL (ref 70–99)
Potassium: 3.4 mmol/L — ABNORMAL LOW (ref 3.5–5.1)
Sodium: 130 mmol/L — ABNORMAL LOW (ref 135–145)

## 2018-12-11 LAB — POCT PREGNANCY, URINE: Preg Test, Ur: NEGATIVE

## 2018-12-11 MED ORDER — DEXAMETHASONE SODIUM PHOSPHATE 10 MG/ML IJ SOLN
10.0000 mg | Freq: Once | INTRAMUSCULAR | Status: AC
  Administered 2018-12-12: 10 mg via INTRAVENOUS
  Filled 2018-12-11: qty 1

## 2018-12-11 MED ORDER — MAGNESIUM SULFATE 2 GM/50ML IV SOLN
2.0000 g | Freq: Once | INTRAVENOUS | Status: AC
  Administered 2018-12-12: 2 g via INTRAVENOUS
  Filled 2018-12-11: qty 50

## 2018-12-11 MED ORDER — DROPERIDOL 2.5 MG/ML IJ SOLN
2.5000 mg | Freq: Once | INTRAMUSCULAR | Status: AC
  Administered 2018-12-12: 2.5 mg via INTRAVENOUS

## 2018-12-11 MED ORDER — DIPHENHYDRAMINE HCL 50 MG/ML IJ SOLN
25.0000 mg | INTRAMUSCULAR | Status: AC
  Administered 2018-12-12: 25 mg via INTRAVENOUS
  Filled 2018-12-11: qty 1

## 2018-12-11 MED ORDER — SODIUM CHLORIDE 0.9 % IV BOLUS
1000.0000 mL | Freq: Once | INTRAVENOUS | Status: AC
  Administered 2018-12-11: 20:00:00 1000 mL via INTRAVENOUS

## 2018-12-11 MED ORDER — SODIUM CHLORIDE 0.9 % IV BOLUS
500.0000 mL | Freq: Once | INTRAVENOUS | Status: AC
  Administered 2018-12-12: 500 mL via INTRAVENOUS

## 2018-12-11 MED ORDER — KETOROLAC TROMETHAMINE 30 MG/ML IJ SOLN
15.0000 mg | Freq: Once | INTRAMUSCULAR | Status: AC
  Administered 2018-12-12: 15 mg via INTRAVENOUS
  Filled 2018-12-11: qty 1

## 2018-12-11 MED ORDER — POTASSIUM CHLORIDE CRYS ER 20 MEQ PO TBCR
40.0000 meq | EXTENDED_RELEASE_TABLET | Freq: Once | ORAL | Status: AC
  Administered 2018-12-12: 40 meq via ORAL
  Filled 2018-12-11: qty 2

## 2018-12-11 NOTE — ED Provider Notes (Signed)
[facility] Emergency Department Provider Note  ____________________________________________   First MD Initiated Contact with Patient 12/11/18 2313     (approximate)  I have reviewed the triage vital signs and the nursing notes.   HISTORY  Chief Complaint Headache and Emesis    HPI Crystal Mckinney is a 43 y.o. female  with history as listed below which includes a number of GI related issues such as celiac disease and essentially chronic or at least recurrent vomiting and diarrhea as well as epilepsy since childhood on a number of different antiepileptic drugs.  She is established with Dr. Melrose Nakayama in neurology and Dr. Alain Marion, a GI specialist at [facility].  She presents tonight with her mother for evaluation of 4 to 5 days of emesis as well as the worst headache of her life x1 week.  She reports that at times her vision is blurry (including tonight) and she is very concerned about persistent symptoms over the last month and a half that include subjective numbness and tingling in her legs with a loss of motor control and coordination.  She says that she frequently requires assistance from 1 or both of her parents to help her to and from the car and even to and from the toilet.  At times she has what she and her mother describe as "spastic movements" of her arms and her legs.  She says that when she saw Dr. Melrose Nakayama about a month ago she was having these movements of her arms at the time.  He thought that perhaps her symptoms were the result of a side effect from her Keppra so he weaned her off of the [place] and she is now on lamotrigine and carbamazepine.  She is used to having GI symptoms but said that the headache is particularly bad, severe, nothing in particular makes it better or worse.  Overall she describes her symptoms as severe and she says she is currently feeling the weakness and the numbness in both legs although she is not having any of the "spastic movements".  She has not  had any contact with COVID-19 patients.  She denies sore throat, chest pain, shortness of breath.  She is not having any abdominal pain except for some vague aching pain which she thinks may be due to the recurrent vomiting.  The patient and her mother are very concerned because the symptoms been going on for more than a month and seem to be worsening and in spite of visits to the neurologist and the [facility] GI specialist they have not been able to identify the issue.  They point out that when they discussed the symptoms with the GI doctor at [facility], that provider recommended going immediately to the emergency department for further evaluation.        Past Medical History:  Diagnosis Date   ADD (attention deficit disorder)    Anxiety    Celiac disease    Seizures ([place])     Patient Active Problem List   Diagnosis Date Noted   Abdominal pain 04/22/2018    Past Surgical History:  Procedure Laterality Date   HIP SURGERY Left     Prior to Admission medications   Medication Sig Start Date End Date Taking? Authorizing Provider  escitalopram (LEXAPRO) 20 MG tablet Take 1 tablet by mouth daily. 02/28/18   [provider]  lamoTRIgine (LAMICTAL) 100 MG tablet Take 200 mg by mouth 2 (two) times daily.  01/15/18 01/15/19  [provider]  levETIRAcetam (  KEPPRA) 500 MG tablet Take 1,500 mg by mouth 2 (two) times daily.  01/15/18 01/15/19  [provider]  LORazepam (ATIVAN) 1 MG tablet Take 1 mg by mouth 2 (two) times daily as needed for anxiety.  03/17/18   [provider]  omeprazole (PRILOSEC) 20 MG capsule Take 1 capsule (20 mg total) by mouth daily. 04/24/18 04/24/19  Epifanio Lesches, MD  ondansetron (ZOFRAN ODT) 4 MG disintegrating tablet Allow 1-2 tablets to dissolve in your mouth every 8 hours as needed for nausea/vomiting 10/17/18   Hinda Kehr, MD  ondansetron (ZOFRAN) 4 MG tablet Take 1 tablet (4 mg total) by mouth every 6 (six) hours as needed for  nausea. 04/24/18   Epifanio Lesches, MD  traMADol (ULTRAM) 50 MG tablet Take 1 tablet (50 mg total) by mouth every 6 (six) hours as needed. 03/31/18   Lorin Picket, PA-C    Allergies Dilantin [phenytoin], Penicillins, and Gluten meal  Family History  Problem Relation Age of Onset   Healthy Mother    Hyperlipidemia Father     Social History Social History   Tobacco Use   Smoking status: Never Smoker   Smokeless tobacco: Never Used  Substance Use Topics   Alcohol use: Yes    Comment: social   Drug use: Never    Review of Systems Constitutional: No fever/chills Eyes: No visual changes. ENT: No sore throat. Cardiovascular: Denies chest pain. Respiratory: Denies shortness of breath. Gastrointestinal: Persistent nausea and vomiting for 4 to 5 days.  Vague generalized aching abdominal pain. Genitourinary: Negative for dysuria. Musculoskeletal: Negative for neck pain.  Negative for back pain. Integumentary: Negative for rash. Neurological: Involuntary spastic movements of arms and legs intermittently for a month and a half, current weakness and subjective sensory deficits in the legs as well as have perceived lack of coordination.  Severe global headache with blurry vision.   ____________________________________________   PHYSICAL EXAM:  VITAL SIGNS: ED Triage Vitals  Enc Vitals Group     BP 12/11/18 2000 131/71     Pulse Rate 12/11/18 2000 82     Resp 12/11/18 2000 17     Temp 12/11/18 2000 98.6 F (37 C)     Temp Source 12/11/18 2000 Oral     SpO2 12/11/18 2000 100 %     Weight 12/11/18 1949 56.7 kg (125 lb)     Height 12/11/18 1949 1.651 m (5' 5" )     Head Circumference --      Peak Flow --      Pain Score 12/11/18 1950 8     Pain Loc --      Pain Edu? --      Excl. in [place]? --     Constitutional: Alert and oriented.  The patient does not appear to be in distress at this time.  She is conversant with me, seems to be in relatively good spirits,  although obviously concerned about her symptoms. Eyes: Conjunctivae are normal.  Pupils are equal and reactive.  No nystagmus.  Extraocular motion is intact. Head: Atraumatic. Nose: No congestion/rhinnorhea. Mouth/Throat: Mucous membranes are moist. Neck: No stridor.  No meningeal signs.   Cardiovascular: Normal rate, regular rhythm. Good peripheral circulation. Grossly normal heart sounds. Respiratory: Normal respiratory effort.  No retractions. Gastrointestinal: Soft and nontender. No distention.  Musculoskeletal: No lower extremity tenderness nor edema. No gross deformities of extremities. Neurologic:  Normal speech and language. No gross focal neurologic deficits are appreciated.  The patient has what appears to  be normal and equal leg raise strength and dorsiflexion and plantarflexion of her feet.  She is reporting subjective sensory deficits in her legs and she says that it they feel numb and tingly "like if you are lying out on the snow".  She has good grip strength any normal and equal bicep and tricep strength in her arms.  I do not appreciate any cranial nerve deficits.  However when she got up to go to the restroom, she asked her mother to help her because she felt so unsteady on her feet and ambulated with a hesitant gait. Skin:  Skin is warm, dry and intact. Psychiatric: Mood and affect are normal. Speech and behavior are normal.  ____________________________________________   LABS (all labs ordered are listed, but only abnormal results are displayed)  Labs Reviewed  CBC WITH DIFFERENTIAL/PLATELET - Abnormal; Notable for the following components:      Result Value   RBC 3.40 (*)    HCT 34.5 (*)    MCV 101.5 (*)    MCH 36.8 (*)    MCHC 36.2 (*)    All other components within normal limits  BASIC METABOLIC PANEL - Abnormal; Notable for the following components:   Sodium 130 (*)    Potassium 3.4 (*)    Chloride 92 (*)    BUN 5 (*)    Anion gap 16 (*)    All other  components within normal limits  URINALYSIS, COMPLETE (UACMP) WITH MICROSCOPIC - Abnormal; Notable for the following components:   Color, Urine YELLOW (*)    APPearance HAZY (*)    Ketones, ur 20 (*)    All other components within normal limits  URINE CULTURE  MAGNESIUM  CARBAMAZEPINE LEVEL, TOTAL  HEAVY METALS, BLOOD  VITAMIN B12  LAMOTRIGINE LEVEL  POCT PREGNANCY, URINE   ____________________________________________  EKG  No indication for EKG ____________________________________________  RADIOLOGY I, Hinda Kehr, personally viewed and evaluated these images (plain radiographs) as part of my medical decision making, as well as reviewing the written report by the radiologist.  I also discussed the imaging plan with Dr. Collins Scotland with radiology prior to ordering the MRIs.  ED MD interpretation:  No acute abnormalities identified on CT nor MRIs  Official radiology report(s): Ct Head Wo Contrast  Result Date: 12/11/2018 CLINICAL DATA:  Headache for the past week. Vomiting for the past 4-5 days. EXAM: CT HEAD WITHOUT CONTRAST TECHNIQUE: Contiguous axial images were obtained from the base of the skull through the vertex without intravenous contrast. COMPARISON:  Brain MRI report dated 01/24/1999 FINDINGS: Brain: Mildly enlarged ventricles and cortical sulci. No intracranial hemorrhage, mass lesion or CT evidence of acute infarction. Vascular: No hyperdense vessel or unexpected calcification. Skull: Normal. Negative for fracture or focal lesion. Sinuses/Orbits: Unremarkable. Other: None. IMPRESSION: No acute abnormality. Mild diffuse cerebral and cerebellar atrophy. Electronically Signed   By: Claudie Revering M.D.   On: 12/11/2018 21:17   Mr Brain W And Wo Contrast  Result Date: 12/12/2018 CLINICAL DATA:  Headache with nausea and vomiting. History of seizures. EXAM: MRI HEAD WITHOUT AND WITH CONTRAST TECHNIQUE: Multiplanar, multiecho pulse sequences of the brain and surrounding structures were  obtained without and with intravenous contrast. CONTRAST:  86m GADAVIST GADOBUTROL 1 MMOL/ML IV SOLN COMPARISON:  None. FINDINGS: BRAIN: There is no acute infarct, acute hemorrhage or extra-axial collection. The white matter signal is normal for the patient's age. The cerebral and cerebellar volume are age-appropriate. There is no hydrocephalus. The midline structures are normal. The hippocampi  are normal and symmetric in size and signal. The hypothalamus and mamillary bodies are normal. There is no cortical ectopia or dysplasia. No abnormal contrast enhancement. VASCULAR: The major intracranial arterial and venous sinus flow voids are normal. Susceptibility-sensitive sequences show no chronic microhemorrhage or superficial siderosis. SKULL AND UPPER CERVICAL SPINE: Calvarial bone marrow signal is normal. There is no skull base mass. The visualized upper cervical spine and soft tissues are normal. SINUSES/ORBITS: There are no fluid levels or advanced mucosal thickening. The mastoid air cells and middle ear cavities are free of fluid. The orbits are normal. IMPRESSION: Normal MRI of the brain. Electronically Signed   By: Ulyses Jarred M.D.   On: 12/12/2018 03:56   Mr Cervical Spine W Or Wo Contrast  Result Date: 12/12/2018 CLINICAL DATA:  Headaches and motor weakness. Multiple neurologic issues. EXAM: MRI TOTAL SPINE WITHOUT AND WITH CONTRAST TECHNIQUE: Multisequence MR imaging of the spine from the cervical spine to the sacrum was performed prior to and following IV contrast administration. CONTRAST:  58m GADAVIST GADOBUTROL 1 MMOL/ML IV SOLN COMPARISON:  None. FINDINGS: MRI CERVICAL SPINE FINDINGS Alignment: Normal Vertebrae: No acute compression fracture, discitis-osteomyelitis, facet edema or other focal marrow lesion. No epidural collection. Cord: Normal. No abnormal contrast enhancement. Posterior Fossa, vertebral arteries, paraspinal tissues: Negative Disc levels: No spinal canal or neural foraminal  stenosis. No disc herniation. MRI THORACIC SPINE FINDINGS Alignment:  Physiologic. Vertebrae: No fracture, evidence of discitis, or bone lesion. Cord:  Normal signal and morphology. No abnormal enhancement. Paraspinal and other soft tissues: Negative. Disc levels: No disc herniation or stenosis. MRI LUMBAR SPINE FINDINGS Segmentation:  Standard Alignment:  Normal Vertebrae: No acute compression fracture, discitis-osteomyelitis, facet edema or other focal marrow lesion. No epidural collection. Conus medullaris: Extends to the L1 level and appears normal. No abnormal contrast enhancement. Paraspinal and other soft tissues: Negative Disc levels: At L4-5, there is a small left foraminal annular fissure in close proximity to the exiting nerve root. No spinal canal or neural foraminal stenosis. The other lumbar intervertebral disc spaces are normal. IMPRESSION: 1. No spinal cord abnormality. 2. Normal MRI of the cervical and thoracic spine. 3. Small left foraminal annular fissure of the L4-5 disc could serve as a source of irritation to the exiting left L4 nerve root. Otherwise normal lumbar spine. Electronically Signed   By: KUlyses JarredM.D.   On: 12/12/2018 04:04   Mr Thoracic Spine W Wo Contrast  Result Date: 12/12/2018 CLINICAL DATA:  Headaches and motor weakness. Multiple neurologic issues. EXAM: MRI TOTAL SPINE WITHOUT AND WITH CONTRAST TECHNIQUE: Multisequence MR imaging of the spine from the cervical spine to the sacrum was performed prior to and following IV contrast administration. CONTRAST:  523mGADAVIST GADOBUTROL 1 MMOL/ML IV SOLN COMPARISON:  None. FINDINGS: MRI CERVICAL SPINE FINDINGS Alignment: Normal Vertebrae: No acute compression fracture, discitis-osteomyelitis, facet edema or other focal marrow lesion. No epidural collection. Cord: Normal. No abnormal contrast enhancement. Posterior Fossa, vertebral arteries, paraspinal tissues: Negative Disc levels: No spinal canal or neural foraminal stenosis.  No disc herniation. MRI THORACIC SPINE FINDINGS Alignment:  Physiologic. Vertebrae: No fracture, evidence of discitis, or bone lesion. Cord:  Normal signal and morphology. No abnormal enhancement. Paraspinal and other soft tissues: Negative. Disc levels: No disc herniation or stenosis. MRI LUMBAR SPINE FINDINGS Segmentation:  Standard Alignment:  Normal Vertebrae: No acute compression fracture, discitis-osteomyelitis, facet edema or other focal marrow lesion. No epidural collection. Conus medullaris: Extends to the L1 level and appears normal. No  abnormal contrast enhancement. Paraspinal and other soft tissues: Negative Disc levels: At L4-5, there is a small left foraminal annular fissure in close proximity to the exiting nerve root. No spinal canal or neural foraminal stenosis. The other lumbar intervertebral disc spaces are normal. IMPRESSION: 1. No spinal cord abnormality. 2. Normal MRI of the cervical and thoracic spine. 3. Small left foraminal annular fissure of the L4-5 disc could serve as a source of irritation to the exiting left L4 nerve root. Otherwise normal lumbar spine. Electronically Signed   By: Ulyses Jarred M.D.   On: 12/12/2018 04:04   Mr Lumbar Spine W Wo Contrast  Result Date: 12/12/2018 CLINICAL DATA:  Headaches and motor weakness. Multiple neurologic issues. EXAM: MRI TOTAL SPINE WITHOUT AND WITH CONTRAST TECHNIQUE: Multisequence MR imaging of the spine from the cervical spine to the sacrum was performed prior to and following IV contrast administration. CONTRAST:  41m GADAVIST GADOBUTROL 1 MMOL/ML IV SOLN COMPARISON:  None. FINDINGS: MRI CERVICAL SPINE FINDINGS Alignment: Normal Vertebrae: No acute compression fracture, discitis-osteomyelitis, facet edema or other focal marrow lesion. No epidural collection. Cord: Normal. No abnormal contrast enhancement. Posterior Fossa, vertebral arteries, paraspinal tissues: Negative Disc levels: No spinal canal or neural foraminal stenosis. No disc  herniation. MRI THORACIC SPINE FINDINGS Alignment:  Physiologic. Vertebrae: No fracture, evidence of discitis, or bone lesion. Cord:  Normal signal and morphology. No abnormal enhancement. Paraspinal and other soft tissues: Negative. Disc levels: No disc herniation or stenosis. MRI LUMBAR SPINE FINDINGS Segmentation:  Standard Alignment:  Normal Vertebrae: No acute compression fracture, discitis-osteomyelitis, facet edema or other focal marrow lesion. No epidural collection. Conus medullaris: Extends to the L1 level and appears normal. No abnormal contrast enhancement. Paraspinal and other soft tissues: Negative Disc levels: At L4-5, there is a small left foraminal annular fissure in close proximity to the exiting nerve root. No spinal canal or neural foraminal stenosis. The other lumbar intervertebral disc spaces are normal. IMPRESSION: 1. No spinal cord abnormality. 2. Normal MRI of the cervical and thoracic spine. 3. Small left foraminal annular fissure of the L4-5 disc could serve as a source of irritation to the exiting left L4 nerve root. Otherwise normal lumbar spine. Electronically Signed   By: KUlyses JarredM.D.   On: 12/12/2018 04:04    ____________________________________________   PROCEDURES   Procedure(s) performed (including Critical Care):  Procedures   ____________________________________________   INITIAL IMPRESSION / MDM / ASSESSMENT AND PLAN / ED COURSE  As part of my medical decision making, I reviewed the following data within the eVictorHistory obtained from family, Nursing notes reviewed and incorporated, Labs reviewed , Old chart reviewed and Notes from prior ED visits   Differential diagnosis includes, but is not limited to, multiple sclerosis, nonspecific neurodegenerative disease, intracranial hemorrhage, meningitis/encephalitis, idiopathic intracranial hypertension, medication side effect, conversion disorder.  The patient has had subacute  symptoms for weeks if not months.  She is in no apparent distress at this time with stable vital signs and she is afebrile.  She has no meningismus.  Lab work is generally reassuring; her labs do appear consistent with the recent vomiting and she has some mild hyponatremia at 130 and hypokalemia at 3.4 with a slightly elevated ion gap which I suspect is due to volume losses.  Her urinalysis is reassuring except for some ketones and a few white blood cells but she is not having dysuria and this is not necessarily consistent with infection.  CBC is normal With  no leukocytosis and a hemoglobin of 12.5.  CT head without contrast is nonacute and does not explain the patient's symptoms.  I had an extended conversation with the patient and her mother who both expressed their concerns about her worsening symptoms over period of time.  I explained that we would try to rule out acute or emergent medical conditions tonight but I may not be able to identify the source of her symptoms or a specific diagnosis in the emergency department.  However given her reported worsening neurological symptoms, I discussed the case with Dr. Collins Scotland with neurology.  The current plan is for [place] to obtain MRIs with and without contrast of the brain and entire spine to look for any evidence of multiple sclerosis as well as to rule out spinal cord impingement or any other neurodegenerative disease.  This will least provide a great deal of information and allow for additional outpatient follow-up even if admission is not necessary tonight.  Additionally, the family is very concerned about the possibility of heavy metals so I am sending the heavy metals panel but explained that these results would not be back for an extended period of time.  For her acute headache which very well may be a migraine as well as her refractory nausea and vomiting, I am treating her with droperidol 2.5 mg IV, Toradol 15 mg IV, 500 mL normal saline (she has already  received a 1 L normal saline bolus), Decadron 10 mg IV, and Benadryl 25 mg IV.  We will continue to monitor.  Prior EKG shows normal QTc interval.   Clinical Course as of Dec 11 605  Mon Dec 12, 2018  0137 Carbamazepine (Tegretol), S: 7.6 [CF]  0137 Magnesium: 2.1 [CF]  0137 Preg Test, Ur: NEGATIVE [CF]  0435 No acute abnormalities identified on MRIs of the brain and throughout the spine.  I will update the patient and recommend close outpatient follow-up with Dr. Melrose Nakayama.   [CF]  0503 Patient says she feels much better and has been getting a good sleep.  Her headache has resolved.  At this time she has no focal neurological deficits and continues to move all of her extremities and says she feels much better.I updated her about the reassuring MRIs and she will follow-up with Dr. Melrose Nakayama.  I do not feel that she needs an emergent lumbar puncture at this time.  I suspect that her symptoms are the results of either medication side effects or again there is a possibility of conversion disorder; I do not believe this is an example of factitious disorder but thus far it is notable that no medical evaluation has been able to explain her constellation of symptoms.  I encouraged her to address her ongoing symptoms with Dr. Melrose Nakayama and to discuss if any additional evaluation or work-up is recommended and to continue her current medications until that time.   [CF]    Clinical Course User Index [CF] Hinda Kehr, MD     ____________________________________________  FINAL CLINICAL IMPRESSION(S) / ED DIAGNOSES  Final diagnoses:  Acute nonintractable headache, unspecified headache type  Paresthesias  Leg weakness, bilateral     MEDICATIONS GIVEN DURING THIS VISIT:  Medications  sodium chloride 0.9 % bolus 1,000 mL (0 mLs Intravenous Stopped 12/11/18 2239)  droperidol (INAPSINE) 2.5 MG/ML injection 2.5 mg (2.5 mg Intravenous Given 12/12/18 0015)  potassium chloride SA (KLOR-CON) CR tablet 40 mEq (40  mEq Oral Given 12/12/18 0011)  sodium chloride 0.9 % bolus 500 mL (0 mLs  Intravenous Stopped 12/12/18 0559)  ketorolac (TORADOL) 30 MG/ML injection 15 mg (15 mg Intravenous Given 12/12/18 0015)  diphenhydrAMINE (BENADRYL) injection 25 mg (25 mg Intravenous Given 12/12/18 0015)  dexamethasone (DECADRON) injection 10 mg (10 mg Intravenous Given 12/12/18 0014)  magnesium sulfate IVPB 2 g 50 mL (0 g Intravenous Stopped 12/12/18 0114)  gadobutrol (GADAVIST) 1 MMOL/ML injection 5 mL (5 mLs Intravenous Contrast Given 12/12/18 0255)     ED Discharge Orders    None      *Please note:  Crystal Mckinney was evaluated in Emergency Department on 12/12/2018 for the symptoms described in the history of present illness. She was evaluated in the context of the global COVID-19 pandemic, which necessitated consideration that the patient might be at risk for infection with the SARS-CoV-2 virus that causes COVID-19. Institutional protocols and algorithms that pertain to the evaluation of patients at risk for COVID-19 are in a state of rapid change based on information released by regulatory bodies including the CDC and federal and state organizations. These policies and algorithms were followed during the patient's care in the ED.  Some ED evaluations and interventions may be delayed as a result of limited staffing during the pandemic.*  Note:  This document was prepared using Dragon voice recognition software and may include unintentional dictation errors.   Hinda Kehr, MD 12/12/18 [phone]

## 2018-12-11 NOTE — ED Notes (Signed)
ED Provider at bedside. 

## 2018-12-11 NOTE — ED Notes (Signed)
Per MD Monks, no further orders at this time.

## 2018-12-11 NOTE — ED Triage Notes (Signed)
Pt states that she has had x 4-5 days of emesis. Pt states that she has had a HA x 1 week and now her vision is blurry. Pt states that she has had rectal phenergan and Zofran with no relief. Pt states that her GI doctor wanted her to stress that she has a massive HA and no motor control in her legs. Pt also reports that she saw Dr. Melrose Nakayama for neurology in August and is on [place] for her seizures. Pt is now weaned off Keppra and added Lamictal and Tegretol due to Dr. Melrose Nakayama believing that the [place] was the culprit for the neurologic issues. Pt is in NAD at this time and able to move all extremeties without difficulty.

## 2018-12-12 ENCOUNTER — Emergency Department: Payer: BLUE CROSS/BLUE SHIELD

## 2018-12-12 LAB — VITAMIN B12: Vitamin B-12: 227 pg/mL (ref 180–914)

## 2018-12-12 LAB — CARBAMAZEPINE LEVEL, TOTAL: Carbamazepine Lvl: 7.6 ug/mL (ref 4.0–12.0)

## 2018-12-12 LAB — MAGNESIUM: Magnesium: 2.1 mg/dL (ref 1.7–2.4)

## 2018-12-12 MED ORDER — GADOBUTROL 1 MMOL/ML IV SOLN
5.0000 mL | Freq: Once | INTRAVENOUS | Status: AC | PRN
  Administered 2018-12-12: 5 mL via INTRAVENOUS

## 2018-12-12 NOTE — Discharge Instructions (Signed)
Your workup in the Emergency Department today was reassuring.  We did not find any specific abnormalities.  We have been obtained MRIs with and without contrast of your brain, cervical, thoracic, and lumbar spines, and there was no abnormality or lesion to explain your constellation of symptoms.  Your lab work was also reassuring and as per our discussion we sent a heavy metals panel but this will take several days for a result (you should be able to look on my chart for the results).  We recommend you drink plenty of fluids, take your regular medications and/or any new ones prescribed today, and follow up with the doctor(s) listed in these documents as recommended.  Return to the Emergency Department if you develop new or worsening symptoms that concern you.

## 2018-12-12 NOTE — ED Notes (Signed)
Patient assisted to bathroom and given ice water at this time.

## 2018-12-12 NOTE — ED Notes (Signed)
Patient transported to MRI 

## 2018-12-12 NOTE — ED Notes (Signed)
Patient returned from MRI.

## 2018-12-13 LAB — HEAVY METALS, BLOOD
Arsenic: 7 ug/L (ref 2–23)
Lead: 2 ug/dL (ref 0–4)
Mercury: <1 ug/L (ref 0.0–14.9)

## 2018-12-13 LAB — URINE CULTURE
Culture: >=100000 — AB
Special Requests: NORMAL

## 2018-12-13 LAB — LAMOTRIGINE LEVEL: Lamotrigine Lvl: 9.3 ug/mL (ref 2.0–20.0)

## 2018-12-15 NOTE — Consult Note (Signed)
ED Antimicrobial Stewardship Positive Culture Follow Up   Crystal Mckinney is an 43 y.o. female who presented to [facility] on 12/11/2018 with a chief complaint of  Chief Complaint  Patient presents with  . Headache  . Emesis    Recent Results (from the past 720 hour(s))  Urine Culture     Status: Abnormal   Collection Time: 12/11/18  7:57 PM   Specimen: Urine, Random  Result Value Ref Range Status   Specimen Description   Final    URINE, RANDOM Performed at [facility], [address], [place], [place] [address]    Special Requests   Final    Normal Performed at [facility], [address] [place], [place] [address]    Culture >=100,000 COLONIES/mL STAPHYLOCOCCUS EPIDERMIDIS (A)  Final   Report Status 12/13/2018 FINAL  Final   Organism ID, Bacteria STAPHYLOCOCCUS EPIDERMIDIS (A)  Final      Susceptibility   Staphylococcus epidermidis - MIC*    CIPROFLOXACIN <=0.5 SENSITIVE Sensitive     GENTAMICIN <=0.5 SENSITIVE Sensitive     NITROFURANTOIN <=16 SENSITIVE Sensitive     OXACILLIN >=4 RESISTANT Resistant     TETRACYCLINE 2 SENSITIVE Sensitive     VANCOMYCIN 2 SENSITIVE Sensitive     TRIMETH/SULFA 40 SENSITIVE Sensitive     CLINDAMYCIN >=8 RESISTANT Resistant     RIFAMPIN <=0.5 SENSITIVE Sensitive     Inducible Clindamycin NEGATIVE Sensitive     * >=100,000 COLONIES/mL STAPHYLOCOCCUS EPIDERMIDIS    Patient discharged originally without antimicrobial agent, now with positive Ucx. UA is negative.  Patient was asymptomatic and had negative pregnancy test.  Case discussed with Dr. Joan Mayans.  No antibiotic treatment indicated for asymptomatic bacteruria.   ED Provider: Dr. Derrell Lolling   Pernell Dupre ,PHARMD, BCPS Clinical Pharmacist  12/15/2018, 1:46 PM

## 2019-08-26 ENCOUNTER — Ambulatory Visit
Admission: EM | Admit: 2019-08-26 | Discharge: 2019-08-26 | Disposition: A | Discharge status: Home / Self Care | Payer: BLUE CROSS/BLUE SHIELD | Attending: Internal Medicine | Admitting: Internal Medicine

## 2019-08-26 DIAGNOSIS — K2900 Acute gastritis without bleeding: Secondary | ICD-10-CM | POA: Diagnosis present

## 2019-08-26 LAB — COMPREHENSIVE METABOLIC PANEL
ALT: 35 U/L (ref 0–44)
AST: 104 U/L — ABNORMAL HIGH (ref 15–41)
Albumin: 4.6 g/dL (ref 3.5–5.0)
Alkaline Phosphatase: 127 U/L — ABNORMAL HIGH (ref 38–126)
Anion gap: 15 (ref 5–15)
BUN: 7 mg/dL (ref 6–20)
CO2: 23 mmol/L (ref 22–32)
Calcium: 9.5 mg/dL (ref 8.9–10.3)
Chloride: 92 mmol/L — ABNORMAL LOW (ref 98–111)
Creatinine, Ser: 0.56 mg/dL (ref 0.44–1.00)
GFR calc Af Amer: >60 mL/min (ref >60)
GFR calc non Af Amer: >60 mL/min (ref >60)
Glucose, Bld: 97 mg/dL (ref 70–99)
Potassium: 4.4 mmol/L (ref 3.5–5.1)
Sodium: 130 mmol/L — ABNORMAL LOW (ref 135–145)
Total Bilirubin: 0.4 mg/dL (ref 0.3–1.2)
Total Protein: 8 g/dL (ref 6.5–8.1)

## 2019-08-26 LAB — CBC WITH DIFFERENTIAL/PLATELET
Abs Immature Granulocytes: 0.02 10*3/uL (ref 0.00–0.07)
Basophils Absolute: 0 10*3/uL (ref 0.0–0.1)
Basophils Relative: 0 %
Eosinophils Absolute: 0 10*3/uL (ref 0.0–0.5)
Eosinophils Relative: 0 %
HCT: 33.5 % — ABNORMAL LOW (ref 36.0–46.0)
Hemoglobin: 12 g/dL (ref 12.0–15.0)
Immature Granulocytes: 0 %
Lymphocytes Relative: 16 %
Lymphs Abs: 0.9 10*3/uL (ref 0.7–4.0)
MCH: 36.5 pg — ABNORMAL HIGH (ref 26.0–34.0)
MCHC: 35.8 g/dL (ref 30.0–36.0)
MCV: 101.8 fL — ABNORMAL HIGH (ref 80.0–100.0)
Monocytes Absolute: 0.8 10*3/uL (ref 0.1–1.0)
Monocytes Relative: 14 %
Neutro Abs: 3.9 10*3/uL (ref 1.7–7.7)
Neutrophils Relative %: 70 %
Platelets: 241 10*3/uL (ref 150–400)
RBC: 3.29 MIL/uL — ABNORMAL LOW (ref 3.87–5.11)
RDW: 11.5 % (ref 11.5–15.5)
WBC: 5.6 10*3/uL (ref 4.0–10.5)
nRBC: 0 % (ref 0.0–0.2)

## 2019-08-26 LAB — LIPASE, BLOOD: Lipase: 359 U/L — ABNORMAL HIGH (ref 11–51)

## 2019-08-26 MED ORDER — FAMOTIDINE 20 MG PO TABS: 20.0000 mg | ORAL_TABLET | Freq: Two times a day (BID) | ORAL | 0 refills | Status: DC

## 2019-08-26 NOTE — ED Triage Notes (Signed)
Pt had abdominal pain approx 6 days ago.  Stayed about the same for a few days then became progressively worse for the last 72 hours.  Pain is aching and tightness in midsternal to periumbilical area.  TTP.  Reports swelling in abdomen over the last three days and had to wear a larger size in shorts today.  Has had diarrhea today.  Pt has hx of celiac disease and had similar episode 04/2018. Was admitted for 4 days but they couldn't identify a cause.

## 2019-08-27 NOTE — ED Provider Notes (Signed)
[facility]    CSN: [id number] Arrival date & time: 08/26/19  1506      History   Chief Complaint Chief Complaint  Patient presents with  . Abdominal Pain    HPI Crystal Mckinney is a 44 y.o. female comes to the urgent care with abdominal pain of 6 days duration.  Patient says the pain started 6 days ago and has gotten progressively worse over the past 72 hours.  Pain is in the midsternal periumbilical area.  No known relieving factors.  Is aggravated by eating food and sometimes when she eats food he feels like the food is sitting in  her chest.  She feels bloated and has had loose bowel movement today.  No change in the patient's diet but they travel to [place] for about week.   HPI  Past Medical History:  Diagnosis Date  . ADD (attention deficit disorder)   . Anxiety   . Celiac disease   . Seizures [facility])     Patient Active Problem List   Diagnosis Date Noted  . Abdominal pain 04/22/2018    Past Surgical History:  Procedure Laterality Date  . HIP SURGERY Left     OB History   No obstetric history on file.      Home Medications    Prior to Admission medications   Medication Sig Start Date End Date Taking? Authorizing Provider  carbamazepine (TEGRETOL XR) 200 MG 12 hr tablet Take 200 mg by mouth 2 (two) times daily. 2 tablets in morning and 2 tablets at night.   Yes [provider]  Multiple Vitamins-Minerals (MULTIVITAMIN WITH MINERALS) tablet Take 1 tablet by mouth daily.   Yes [provider]  Venlafaxine HCl (EFFEXOR XR PO) Take by mouth daily.   Yes [provider]  famotidine (PEPCID) 20 MG tablet Take 1 tablet (20 mg total) by mouth 2 (two) times daily. 08/26/19   Crystal Mckinney, Crystal Galas, MD  escitalopram (LEXAPRO) 20 MG tablet Take 1 tablet by mouth daily. 02/28/18 08/26/19  [provider]  lamoTRIgine (LAMICTAL) 100 MG tablet Take 200 mg by mouth 2 (two) times daily.  01/15/18 08/26/19  [provider]    levETIRAcetam (KEPPRA) 500 MG tablet Take 1,500 mg by mouth 2 (two) times daily.  01/15/18 08/26/19  [provider]  omeprazole (PRILOSEC) 20 MG capsule Take 1 capsule (20 mg total) by mouth daily. 04/24/18 08/26/19  Epifanio Lesches, MD    Family History Family History  Problem Relation Age of Onset  . Healthy Mother   . Hyperlipidemia Father     Social History Social History   Tobacco Use  . Smoking status: Never Smoker  . Smokeless tobacco: Never Used  Vaping Use  . Vaping Use: Never used  Substance Use Topics  . Alcohol use: Yes    Comment: social  . Drug use: Never     Allergies   Dilantin [phenytoin], Penicillins, and Gluten meal   Review of Systems Review of Systems  Constitutional: Positive for fatigue. Negative for activity change and fever.  Respiratory: Negative for cough, shortness of breath and wheezing.   Gastrointestinal: Positive for abdominal pain, diarrhea and nausea. Negative for vomiting.  Genitourinary: Negative.   Skin: Negative.   Neurological: Negative for dizziness, numbness and headaches.     Physical Exam Triage Vital Signs ED Triage Vitals  Enc Vitals Group     BP 08/26/19 1533 (!) 138/95     Pulse Rate 08/26/19 1533 99  Resp 08/26/19 1533 18     Temp 08/26/19 1533 98.5 F (36.9 C)     Temp Source 08/26/19 1533 Oral     SpO2 08/26/19 1533 100 %     Weight --      Height --      Head Circumference --      Peak Flow --      Pain Score 08/26/19 1529 6     Pain Loc --      Pain Edu? --      Excl. in [place]? --    No data found.  Updated Vital Signs BP (!) 138/95 (BP Location: Left Arm)   Pulse 99   Temp 98.5 F (36.9 C) (Oral)   Resp 18   LMP 08/12/2019 (Approximate)   SpO2 100%   Visual Acuity Right Eye Distance:   Left Eye Distance:   Bilateral Distance:    Right Eye Near:   Left Eye Near:    Bilateral Near:     Physical Exam Vitals and nursing note reviewed.  Constitutional:      General: She is  not in acute distress.    Appearance: She is not ill-appearing.  Cardiovascular:     Heart sounds: Normal heart sounds.  Pulmonary:     Effort: Pulmonary effort is normal.     Breath sounds: Normal breath sounds.  Abdominal:     Palpations: Abdomen is soft. There is no shifting dullness, hepatomegaly or splenomegaly.     Tenderness: There is abdominal tenderness in the epigastric area. There is no right CVA tenderness or guarding. Negative signs include Murphy's sign, Rovsing's sign and McBurney's sign.     Hernia: No hernia is present.  Skin:    General: Skin is warm.     Capillary Refill: Capillary refill takes less than 2 seconds.  Neurological:     Mental Status: She is alert.      UC Treatments / Results  Labs (all labs ordered are listed, but only abnormal results are displayed) Labs Reviewed  COMPREHENSIVE METABOLIC PANEL - Abnormal; Notable for the following components:      Result Value   Sodium 130 (*)    Chloride 92 (*)    AST 104 (*)    Alkaline Phosphatase 127 (*)    All other components within normal limits  CBC WITH DIFFERENTIAL/PLATELET - Abnormal; Notable for the following components:   RBC 3.29 (*)    HCT 33.5 (*)    MCV 101.8 (*)    MCH 36.5 (*)    All other components within normal limits  LIPASE, BLOOD - Abnormal; Notable for the following components:   Lipase 359 (*)    All other components within normal limits    EKG   Radiology No results found.  Procedures Procedures (including critical care time)  Medications Ordered in UC Medications - No data to display  Initial Impression / Assessment and Plan / UC Course  I have reviewed the triage vital signs and the nursing notes.  Pertinent labs & imaging results that were available during my care of the patient were reviewed by me and considered in my medical decision making (see chart for details).     1.  Acute superficial gastritis without hemorrhage: Patient is advised to take Pepcid  twice daily If the patient's symptoms does not improve she needs to follow-up with a gastroenterologist. CBC, CMP, lipase level. Continue gluten-free diet Return precautions given. Final Clinical Impressions(s) / UC Diagnoses  Final diagnoses:  Acute superficial gastritis without hemorrhage   Discharge Instructions   None    ED Prescriptions    Medication Sig Dispense Auth. Provider   famotidine (PEPCID) 20 MG tablet Take 1 tablet (20 mg total) by mouth 2 (two) times daily. 30 tablet Kayl Stogdill, Crystal Galas, MD     PDMP not reviewed this encounter.   Chase Picket, MD 08/27/19 [phone]

## 2019-09-01 ENCOUNTER — Ambulatory Visit
Admission: EM | Admit: 2019-09-01 | Discharge: 2019-09-01 | Discharge status: Against Medical Advice | Payer: BLUE CROSS/BLUE SHIELD

## 2019-09-01 ENCOUNTER — Other Ambulatory Visit: Payer: Self-pay

## 2019-11-06 ENCOUNTER — Ambulatory Visit (INDEPENDENT_AMBULATORY_CARE_PROVIDER_SITE_OTHER): Payer: BLUE CROSS/BLUE SHIELD

## 2019-11-06 ENCOUNTER — Ambulatory Visit
Admission: EM | Admit: 2019-11-06 | Discharge: 2019-11-06 | Disposition: A | Discharge status: Home / Self Care | Payer: BLUE CROSS/BLUE SHIELD

## 2019-11-06 ENCOUNTER — Other Ambulatory Visit: Payer: Self-pay

## 2019-11-06 ENCOUNTER — Encounter: Payer: Self-pay | Admitting: Emergency Medicine

## 2019-11-06 DIAGNOSIS — R079 Chest pain, unspecified: Secondary | ICD-10-CM

## 2019-11-06 DIAGNOSIS — S20211A Contusion of right front wall of thorax, initial encounter: Secondary | ICD-10-CM | POA: Diagnosis not present

## 2019-11-06 DIAGNOSIS — S2232XA Fracture of one rib, left side, initial encounter for closed fracture: Secondary | ICD-10-CM | POA: Diagnosis not present

## 2019-11-06 NOTE — ED Provider Notes (Signed)
[facility]    CSN: [id number] Arrival date & time: 11/06/19  1008      History   Chief Complaint Chief Complaint  Patient presents with  . Fall    DOI 11/03/19  . Rib Injury    left side    HPI Crystal Mckinney is a 44 y.o. female who presents with L rib pain due to falling 3 days ago when her small dog walked in her path. She hit her ribs on bricks. She has been icing it and taking OTC meds, but noticed a clicking under her L breast when she breaths deep and wanted to be checked. Denies pain anywhere else. Denies SOB or pain anywhere else.     Past Medical History:  Diagnosis Date  . ADD (attention deficit disorder)   . Anxiety   . Celiac disease   . Seizures [facility])     Patient Active Problem List   Diagnosis Date Noted  . Abdominal pain 04/22/2018    Past Surgical History:  Procedure Laterality Date  . HIP SURGERY Left     OB History   No obstetric history on file.      Home Medications    Prior to Admission medications   Medication Sig Start Date End Date Taking? Authorizing Provider  carbamazepine (TEGRETOL XR) 200 MG 12 hr tablet Take 200 mg by mouth 2 (two) times daily. 2 tablets in morning and 2 tablets at night.   Yes [provider]  famotidine (PEPCID) 20 MG tablet Take 1 tablet (20 mg total) by mouth 2 (two) times daily. 08/26/19  Yes Lamptey, Myrene Galas, MD  levonorgestrel (MIRENA) 20 MCG/24HR IUD 1 each by Intrauterine route once.   Yes [provider]  Multiple Vitamins-Minerals (MULTIVITAMIN WITH MINERALS) tablet Take 1 tablet by mouth daily.   Yes [provider]  Venlafaxine HCl (EFFEXOR XR PO) Take by mouth daily.   Yes [provider]  escitalopram (LEXAPRO) 20 MG tablet Take 1 tablet by mouth daily. 02/28/18 08/26/19  [provider]  lamoTRIgine (LAMICTAL) 100 MG tablet Take 200 mg by mouth 2 (two) times daily.  01/15/18 08/26/19  [provider]  levETIRAcetam (KEPPRA) 500 MG tablet  Take 1,500 mg by mouth 2 (two) times daily.  01/15/18 08/26/19  [provider]  omeprazole (PRILOSEC) 20 MG capsule Take 1 capsule (20 mg total) by mouth daily. 04/24/18 08/26/19  Epifanio Lesches, MD    Family History Family History  Problem Relation Age of Onset  . Healthy Mother   . Hyperlipidemia Father     Social History Social History   Tobacco Use  . Smoking status: Never Smoker  . Smokeless tobacco: Never Used  Vaping Use  . Vaping Use: Never used  Substance Use Topics  . Alcohol use: Yes    Comment: social  . Drug use: Never     Allergies   Dilantin [phenytoin], Penicillins, and Gluten meal   Review of Systems Review of Systems  Constitutional: Negative for fever.  HENT: Negative for congestion.   Respiratory: Negative for cough and shortness of breath.   Gastrointestinal: Negative for abdominal pain.  Musculoskeletal: Negative for gait problem.  Skin: Negative for rash and wound.  Neurological: Negative for dizziness, weakness, numbness and headaches.     Physical Exam Triage Vital Signs ED Triage Vitals  Enc Vitals Group     BP 11/06/19 1048 109/77     Pulse Rate 11/06/19 1048 81     Resp  11/06/19 1048 18     Temp 11/06/19 1048 98.4 F (36.9 C)     Temp Source 11/06/19 1048 Oral     SpO2 11/06/19 1048 100 %     Weight 11/06/19 1048 132 lb (59.9 kg)     Height 11/06/19 1048 5' 5"  (1.651 m)     Head Circumference --      Peak Flow --      Pain Score 11/06/19 1047 7     Pain Loc --      Pain Edu? --      Excl. in [place]? --    No data found.  Updated Vital Signs BP 109/77 (BP Location: Right Arm)   Pulse 81   Temp 98.4 F (36.9 C) (Oral)   Resp 18   Ht 5' 5"  (1.651 m)   Wt 132 lb (59.9 kg)   SpO2 100%   BMI 21.97 kg/m   Visual Acuity Right Eye Distance:   Left Eye Distance:   Bilateral Distance:    Right Eye Near:   Left Eye Near:    Bilateral Near:     Physical Exam Vitals and nursing note reviewed.  Constitutional:       General: She is in acute distress.     Appearance: She is normal weight.     Comments: Is in a lot of pain  Pulmonary:     Breath sounds: Normal breath sounds. No wheezing or rales.     Comments: Deep breaths provoke her pain, so she takes shallow breaths. There are no crepitations on area she landed. Her chest all mid lateral is swollen.  Abdominal:     General: Abdomen is flat.     Palpations: There is no mass.     Tenderness: There is no abdominal tenderness.  Skin:    General: Skin is warm and dry.     Findings: No bruising or rash.  Neurological:     Mental Status: She is alert and oriented to person, place, and time.     Gait: Gait normal.  Psychiatric:        Mood and Affect: Mood normal.        Behavior: Behavior normal.        Thought Content: Thought content normal.        Judgment: Judgment normal.     UC Treatments / Results  Labs (all labs ordered are listed, but only abnormal results are displayed) Labs Reviewed - No data to display  EKG   Radiology DG Ribs Unilateral W/Chest Left  Result Date: 11/06/2019 CLINICAL DATA:  Fall 11/03/2019.  Left chest pain. EXAM: LEFT RIBS AND CHEST - 3+ VIEW COMPARISON:  10/16/2018 FINDINGS: Cardiac and mediastinal contours normal. Vascularity normal. Lungs are clear without infiltrate effusion or pneumothorax. Nondisplaced acute fracture left 6 rib. Chronic right-sided rib fractures eighth and ninth ribs on the right. No change from prior study. IMPRESSION: Acute fracture left sixth rib without displacement. No effusion or pneumothorax. Lungs are clear. Electronically Signed   By: Franchot Gallo M.D.   On: 11/06/2019 11:22    Procedures Procedures (including critical care time)  Medications Ordered in UC Medications - No data to display  Initial Impression / Assessment and Plan / UC Course  I have reviewed the triage vital signs and the nursing notes. She has L 6th non displaced rib fracture and was educated on the  care for this. She declined pain med.  Pertinent  imaging results that were available  during my care of the patient were reviewed by me and considered in my medical decision making (see chart for details).  Final Clinical Impressions(s) / UC Diagnoses   Final diagnoses:  Chest wall contusion, right, initial encounter     Discharge Instructions     Continue icing, use a pillow to take deep breaths and loosen the ace every 2 hours for 30 minutes to prevent pneumonia   ED Prescriptions    None     PDMP not reviewed this encounter.   Shelby Mattocks, [place] 11/09/19 [phone]

## 2019-11-06 NOTE — ED Triage Notes (Signed)
Patient in today after falling on 11/03/19 and hitting the left side of her ribs on a brick wall.

## 2019-11-06 NOTE — Discharge Instructions (Signed)
Continue icing, use a pillow to take deep breaths and loosen the ace every 2 hours for 30 minutes to prevent pneumonia

## 2021-05-18 IMAGING — DX PORTABLE CHEST - 1 VIEW
1 series · 1 of 1 positions shown · non-contrast
Comparison: April 22, 2018

CLINICAL DATA: Productive cough

EXAM:
PORTABLE CHEST 1 VIEW

[chest ap]
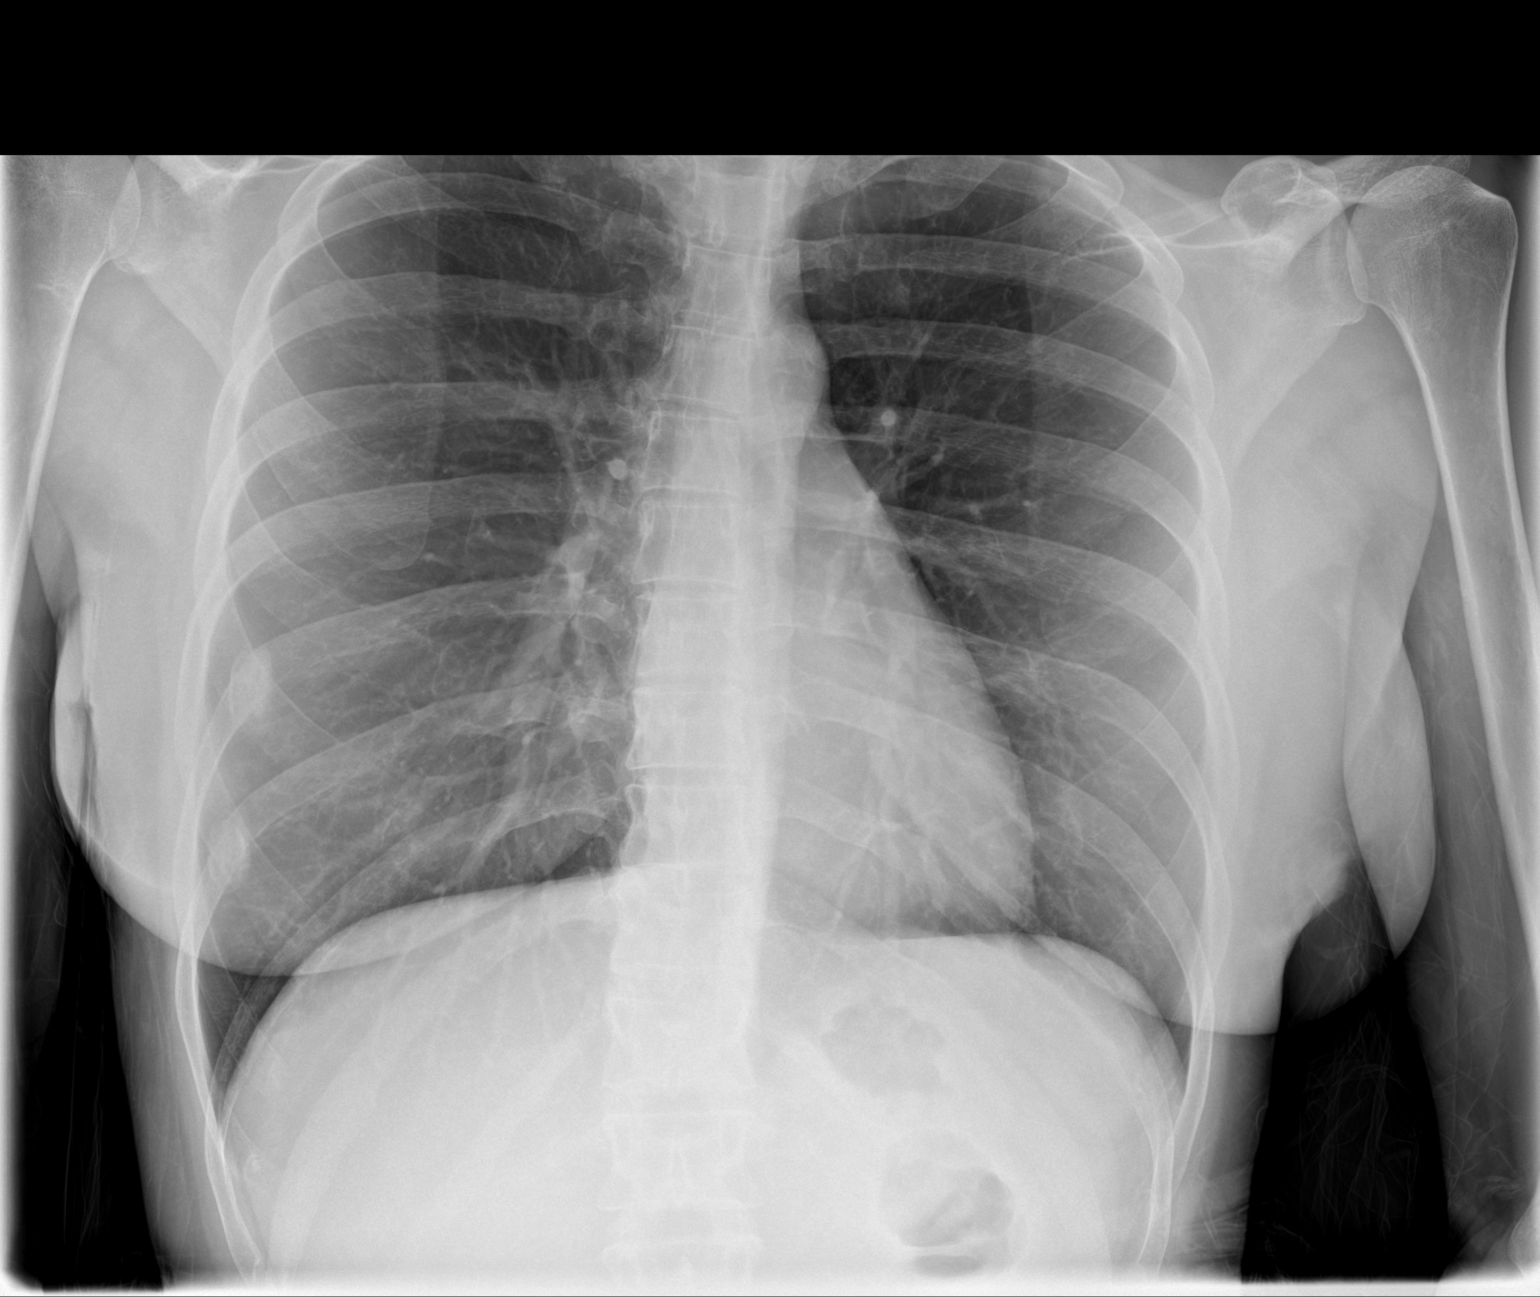

[1 of 1 positions shown; findings below may reference images not displayed]

FINDINGS: There are old right-sided rib fractures. The heart size is stable.
There is no pneumothorax or large pleural effusion. There is no
focal infiltrate. There is no acute osseous abnormality.
IMPRESSION: No active disease.

## 2021-11-13 ENCOUNTER — Ambulatory Visit
Admission: EM | Admit: 2021-11-13 | Discharge: 2021-11-13 | Disposition: A | Discharge status: Home / Self Care | Payer: Medicaid Other | Attending: Emergency Medicine | Admitting: Emergency Medicine

## 2021-11-13 DIAGNOSIS — W19XXXA Unspecified fall, initial encounter: Secondary | ICD-10-CM

## 2021-11-13 DIAGNOSIS — S7002XA Contusion of left hip, initial encounter: Secondary | ICD-10-CM | POA: Diagnosis not present

## 2021-11-13 DIAGNOSIS — Z23 Encounter for immunization: Secondary | ICD-10-CM | POA: Diagnosis not present

## 2021-11-13 DIAGNOSIS — S0181XA Laceration without foreign body of other part of head, initial encounter: Secondary | ICD-10-CM

## 2021-11-13 MED ORDER — TETANUS-DIPHTH-ACELL PERTUSSIS 5-2.5-18.5 LF-MCG/0.5 IM SUSY
0.5000 mL | PREFILLED_SYRINGE | Freq: Once | INTRAMUSCULAR | Status: AC
  Administered 2021-11-13: 0.5 mL via INTRAMUSCULAR

## 2021-11-13 NOTE — Discharge Instructions (Addendum)
Keep this clean and dry for at least 72 hours.  Ideally, the Steri-Strips should stay on for about 7 days, although it should be well on its way to healing within 5 days.  May take 600 mg of ibuprofen combined 1000 mg of Tylenol together 3-4 times a day as needed for pain.  You can ice your hip for the first 72 hours if it continues to bother you.

## 2021-11-13 NOTE — ED Triage Notes (Signed)
Pt c/o fall and hit her left side of her face and hip.  Pt states in the fall she broke her tooth.  Pt has a cut above the left eyebrow and wrapped and iced it last night. Pt states that she is wearing make up to cover the bruising.   Pt states that her left hip is extremely sore.   Pt has had left femur surgery.
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
## 2021-11-28 ENCOUNTER — Ambulatory Visit
Admission: RE | Admit: 2021-11-28 | Discharge: 2021-11-28 | Disposition: A | Discharge status: Home / Self Care | Payer: Medicaid Other | Source: Ambulatory Visit | Attending: Emergency Medicine | Admitting: Emergency Medicine

## 2021-11-28 VITALS — BP 109/70 | HR 89 | Temp 98.2°F | Ht 65.0 in | Wt 123.7 lb

## 2021-11-28 DIAGNOSIS — N390 Urinary tract infection, site not specified: Secondary | ICD-10-CM | POA: Diagnosis not present

## 2021-11-28 DIAGNOSIS — N760 Acute vaginitis: Secondary | ICD-10-CM | POA: Diagnosis not present

## 2021-11-28 DIAGNOSIS — B3731 Acute candidiasis of vulva and vagina: Secondary | ICD-10-CM | POA: Diagnosis not present

## 2021-11-28 DIAGNOSIS — R319 Hematuria, unspecified: Secondary | ICD-10-CM

## 2021-11-28 DIAGNOSIS — B9689 Other specified bacterial agents as the cause of diseases classified elsewhere: Secondary | ICD-10-CM

## 2021-11-28 LAB — URINALYSIS, ROUTINE W REFLEX MICROSCOPIC
Bilirubin Urine: NEGATIVE
Glucose, UA: NEGATIVE mg/dL
Ketones, ur: 15 mg/dL — AB
Nitrite: POSITIVE — AB
Specific Gravity, Urine: 1.01 (ref 1.005–1.030)
pH: 6 (ref 5.0–8.0)

## 2021-11-28 LAB — URINALYSIS, MICROSCOPIC (REFLEX): WBC, UA: >50 WBC/hpf (ref 0–5)

## 2021-11-28 LAB — WET PREP, GENITAL
Sperm: NONE SEEN
Trich, Wet Prep: NONE SEEN
WBC, Wet Prep HPF POC: <10 (ref <10)

## 2021-11-28 MED ORDER — METRONIDAZOLE 500 MG PO TABS: 500.0000 mg | ORAL_TABLET | Freq: Two times a day (BID) | ORAL | 0 refills | Status: AC

## 2021-11-28 MED ORDER — SULFAMETHOXAZOLE-TRIMETHOPRIM 800-160 MG PO TABS: 1.0000 | ORAL_TABLET | Freq: Two times a day (BID) | ORAL | 0 refills | Status: AC

## 2021-11-28 MED ORDER — FLUCONAZOLE 150 MG PO TABS: 150.0000 mg | ORAL_TABLET | Freq: Once | ORAL | 1 refills | Status: AC

## 2021-11-28 NOTE — Discharge Instructions (Addendum)
Finish all of the antibiotics, even if you feel better.  I am treating you with Bactrim for 10 days for possible early kidney infection since you have tolerated this without any issues before.  I have sent your urine off for culture to make sure that we have you on the right antibiotic.  Make sure you continue drinking plenty of fluids.  Go to the ER for fevers above 100.4, severe back pain that is not controlled with Tylenol/ibuprofen, or for any other concerns.

## 2021-11-28 NOTE — ED Triage Notes (Signed)
Pt c/o fatigue, odor to urine, low grade temp. Pt states she is prone to infections, pt also states urine looks more cloudy

## 2021-11-28 NOTE — ED Provider Notes (Signed)
HPI  SUBJECTIVE:  Crystal Mckinney is a 46 y.o. female who presents with 1 week of lower abdominal pressure, cloudy, odorous urine, malaise.  She reports bilateral back pain and vaginal odor.  No dysuria, urinary urgency, frequency, hematuria, nausea, vomiting, fevers above 100.4, vaginal bleeding, change in her baseline vaginal discharge, genital rash or itching.  She is in a long-term monogamous relationship with a female, who is asymptomatic.  STDs are not a concern today.  No antibiotics in the past month.  She took ibuprofen within 6 hours of evaluation with improvement in her fevers.  She has also been taking Azo with improvement in her symptoms.  No aggravating factors.  She has a past medical history of UTI, pyelonephritis, BV and vaginal yeast infections.  No history of nephrolithiasis, STDs, diabetes.  LMP: Has an IUD.  Denies the possibility of being pregnant.  PCP: She is establishing care with [place] [facility] in November.  Past Medical History:  Diagnosis Date   ADD (attention deficit disorder)    Anxiety    Celiac disease    Seizures (HCC)     Past Surgical History:  Procedure Laterality Date   HIP SURGERY Left     Family History  Problem Relation Age of Onset   Healthy Mother    Hyperlipidemia Father     Social History   Tobacco Use   Smoking status: Never   Smokeless tobacco: Never  Vaping Use   Vaping Use: Never used  Substance Use Topics   Alcohol use: Yes    Comment: social   Drug use: Never    No current facility-administered medications for this encounter.  Current Outpatient Medications:    carbamazepine (TEGRETOL XR) 200 MG 12 hr tablet, Take 200 mg by mouth 2 (two) times daily. 2 tablets in morning and 2 tablets at night., Disp: , Rfl:    famotidine (PEPCID) 20 MG tablet, Take 1 tablet (20 mg total) by mouth 2 (two) times daily., Disp: 30 tablet, Rfl: 0   fluconazole (DIFLUCAN) 150 MG tablet, Take 1 tablet (150 mg total) by mouth once for 1 dose. 1 tab  po x 1. May repeat in 72 hours if no improvement, Disp: 2 tablet, Rfl: 1   levonorgestrel (MIRENA) 20 MCG/24HR IUD, 1 each by Intrauterine route once., Disp: , Rfl:    metroNIDAZOLE (FLAGYL) 500 MG tablet, Take 1 tablet (500 mg total) by mouth 2 (two) times daily for 7 days., Disp: 14 tablet, Rfl: 0   Multiple Vitamins-Minerals (MULTIVITAMIN WITH MINERALS) tablet, Take 1 tablet by mouth daily., Disp: , Rfl:    sulfamethoxazole-trimethoprim (BACTRIM DS) 800-160 MG tablet, Take 1 tablet by mouth 2 (two) times daily for 10 days., Disp: 20 tablet, Rfl: 0   Venlafaxine HCl (EFFEXOR XR PO), Take by mouth daily., Disp: , Rfl:   Allergies  Allergen Reactions   Dilantin [Phenytoin] Anaphylaxis   Penicillins Anaphylaxis   Gluten Meal     "I have celiac"     ROS  As noted in HPI.   Physical Exam  BP 109/70 (BP Location: Left Arm)   Pulse 89   Temp 98.2 F (36.8 C) (Oral)   Ht 5' 5"  (1.651 m)   Wt 56.1 kg   SpO2 99%   BMI 20.58 kg/m   Constitutional: Well developed, well nourished, no acute distress Eyes:  EOMI, conjunctiva normal bilaterally HENT: Normocephalic, atraumatic,mucus membranes moist Respiratory: Normal inspiratory effort Cardiovascular: Normal rate GI: nondistended.  Positive suprapubic tenderness.  Mild  right flank tenderness. Back: Questionable left CVAT skin: No rash, skin intact Musculoskeletal: no deformities Neurologic: Alert & oriented x 3, no focal neuro deficits Psychiatric: Speech and behavior appropriate   ED Course   Medications - No data to display  Orders Placed This Encounter  Procedures   Wet prep, genital    Standing Status:   Standing    Number of Occurrences:   1   Urine Culture    Standing Status:   Standing    Number of Occurrences:   1    Order Specific Question:   Indication    Answer:   Suprapubic pain   Urinalysis, Routine w reflex microscopic Urine, Clean Catch    Standing Status:   Standing    Number of Occurrences:   1    Urinalysis, Microscopic (reflex)    Standing Status:   Standing    Number of Occurrences:   1    Results for orders placed or performed during the hospital encounter of 11/28/21 (from the past 24 hour(s))  Urinalysis, Routine w reflex microscopic Urine, Clean Catch     Status: Abnormal   Collection Time: 11/28/21  9:11 AM  Result Value Ref Range   Color, Urine YELLOW YELLOW   APPearance CLOUDY (A) CLEAR   Specific Gravity, Urine 1.010 1.005 - 1.030   pH 6.0 5.0 - 8.0   Glucose, UA NEGATIVE NEGATIVE mg/dL   Hgb urine dipstick TRACE (A) NEGATIVE   Bilirubin Urine NEGATIVE NEGATIVE   Ketones, ur 15 (A) NEGATIVE mg/dL   Protein, ur TRACE (A) NEGATIVE mg/dL   Nitrite POSITIVE (A) NEGATIVE   Leukocytes,Ua MODERATE (A) NEGATIVE  Wet prep, genital     Status: Abnormal   Collection Time: 11/28/21  9:11 AM   Specimen: Vaginal  Result Value Ref Range   Yeast Wet Prep HPF POC PRESENT (A) NONE SEEN   Trich, Wet Prep NONE SEEN NONE SEEN   Clue Cells Wet Prep HPF POC PRESENT (A) NONE SEEN   WBC, Wet Prep HPF POC <10 <10   Sperm NONE SEEN   Urinalysis, Microscopic (reflex)     Status: Abnormal   Collection Time: 11/28/21  9:11 AM  Result Value Ref Range   RBC / HPF 21-50 0 - 5 RBC/hpf   WBC, UA >50 0 - 5 WBC/hpf   Bacteria, UA MANY (A) NONE SEEN   Squamous Epithelial / LPF 6-10 0 - 5   No results found.  ED Clinical Impression  1. Urinary tract infection with hematuria, site unspecified   2. Vaginal yeast infection   3. BV (bacterial vaginosis)      ED Assessment/Plan      UA consistent with UTI with positive nitrites, moderate leukocytes, many bacteria.  Will send off for culture to confirm antibiotic choice.  Last urine culture in 2020 was Staph epidermidis that was sensitive to Cipro, Macrobid, Bactrim.  Concern for ascending UTI/early pyelonephritis, she is afebrile, but took ibuprofen within 6 hours of evaluation.  Will send home with Bactrim 1 tab p.o. twice daily x10 days  since last urine culture was sensitive to Bactrim.  There were no susceptibilities to cephalosporins.  She has tolerated Bactrim previously without any issues.  She also has a vaginal yeast infection and bacterial vaginosis.  Home with Diflucan and Flagyl.  Discussed labs,  MDM, treatment plan, and plan for follow-up with patient. patient agrees with plan.   Meds ordered this encounter  Medications   sulfamethoxazole-trimethoprim (BACTRIM DS) 800-160  MG tablet    Sig: Take 1 tablet by mouth 2 (two) times daily for 10 days.    Dispense:  20 tablet    Refill:  0   metroNIDAZOLE (FLAGYL) 500 MG tablet    Sig: Take 1 tablet (500 mg total) by mouth 2 (two) times daily for 7 days.    Dispense:  14 tablet    Refill:  0   fluconazole (DIFLUCAN) 150 MG tablet    Sig: Take 1 tablet (150 mg total) by mouth once for 1 dose. 1 tab po x 1. May repeat in 72 hours if no improvement    Dispense:  2 tablet    Refill:  1      *This clinic note was created using [employer]. Therefore, there may be occasional mistakes despite careful proofreading.  ?    Melynda Ripple, MD 11/28/21 1023

## 2021-12-01 LAB — URINE CULTURE: Culture: >=100000 — AB

## 2022-01-20 ENCOUNTER — Encounter: Payer: Self-pay | Admitting: Family Medicine

## 2022-01-20 ENCOUNTER — Ambulatory Visit: Payer: Medicaid Other | Admitting: Family Medicine

## 2022-01-20 VITALS — BP 110/70 | HR 76 | Ht 65.0 in | Wt 121.0 lb

## 2022-01-20 DIAGNOSIS — K279 Peptic ulcer, site unspecified, unspecified as acute or chronic, without hemorrhage or perforation: Secondary | ICD-10-CM | POA: Insufficient documentation

## 2022-01-20 DIAGNOSIS — R569 Unspecified convulsions: Secondary | ICD-10-CM | POA: Diagnosis not present

## 2022-01-20 DIAGNOSIS — K8681 Exocrine pancreatic insufficiency: Secondary | ICD-10-CM | POA: Insufficient documentation

## 2022-01-20 MED ORDER — PANCRELIPASE (LIP-PROT-AMYL) 36000-114000 UNITS PO CPEP: ORAL_CAPSULE | ORAL | 2 refills | Status: DC

## 2022-01-20 MED ORDER — PANTOPRAZOLE SODIUM 40 MG PO TBEC: 40.0000 mg | DELAYED_RELEASE_TABLET | Freq: Two times a day (BID) | ORAL | 2 refills | Status: DC

## 2022-01-20 MED ORDER — PANTOPRAZOLE SODIUM 40 MG PO TBEC: 40.0000 mg | DELAYED_RELEASE_TABLET | Freq: Every day | ORAL | 2 refills | Status: DC

## 2022-01-20 NOTE — Patient Instructions (Addendum)
-   Maintain follow-up with [facility] - Continue pancreatic enzymes - Continue pantoprazole ONCE daily - Contact for questions / concerns - Return in 3 months for annual physical

## 2022-01-20 NOTE — Assessment & Plan Note (Signed)
Per discharge summary from hospital encounter 12/02/21 - 01/03/2022, profuse diarrhea addressed with fecal elastase and noted to be <200. As such will refill pancreatic enzymes and she has plans to establish with [facility]. Will follow peripherally.

## 2022-01-20 NOTE — Progress Notes (Signed)
     Primary Care / Sports Medicine Office Visit  Patient Information:  Patient ID: SOFHIA ULIBARRI, female DOB: [birth year] Age: 46 y.o. MRN: [id number]   Kynesha KENDYL FESTA is a pleasant 46 y.o. female presenting with the following:  Chief Complaint  Patient presents with   Establish Care    Vitals:   01/20/22 1021  BP: 110/70  Pulse: 76  SpO2: 99%   Vitals:   01/20/22 1021  Weight: 121 lb (54.9 kg)  Height: 5' 5"  (1.651 m)   Body mass index is 20.14 kg/m.  No results found.   Independent interpretation of notes and tests performed by another provider:   None  Procedures performed:   None  Pertinent History, Exam, Impression, and Recommendations:   Problem List Items Addressed This Visit       Digestive   Exocrine pancreatic insufficiency - Primary    Per discharge summary from hospital encounter 12/02/21 - 01/03/2022, profuse diarrhea addressed with fecal elastase and noted to be <200. As such will refill pancreatic enzymes and she has plans to establish with [facility]. Will follow peripherally.      Relevant Medications   lipase/protease/amylase (CREON) 36000 UNITS CPEP capsule   PUD (peptic ulcer disease)    Per discharge summary from admission at [facility] from 12/02/21 - 01/03/2022, noted PUD in setting of hematemesis and hematochezia. After EGD sh was noted to have ulcerations and started on pantoprazole BID x 30 days then transition to daily dosing. This was sent to the patient's pharmacy and I have encouraged her to keep follow-up with [facility].      Relevant Medications   pantoprazole (PROTONIX) 40 MG tablet     Other   Seizures (HCC)    Chronic, diagnosed during childhood, managed by [facility]. Will follow peripherally.      Relevant Medications   cloBAZam (ONFI) 10 MG tablet     Orders & Medications Meds ordered this encounter  Medications   lipase/protease/amylase (CREON) 36000 UNITS CPEP capsule    Sig: 2 capsules by mouth three times a day  with meals, 1 capsule by mouth as needed with snacks    Dispense:  180 capsule    Refill:  2   DISCONTD: pantoprazole (PROTONIX) 40 MG tablet    Sig: Take 1 tablet (40 mg total) by mouth 2 (two) times daily.    Dispense:  60 tablet    Refill:  2   pantoprazole (PROTONIX) 40 MG tablet    Sig: Take 1 tablet (40 mg total) by mouth daily.    Dispense:  30 tablet    Refill:  2   No orders of the defined types were placed in this encounter.    Return in about 3 months (around 04/22/2022) for CPE.     Montel Culver, MD   Primary Care Sports Medicine [place]

## 2022-01-20 NOTE — Assessment & Plan Note (Signed)
Per discharge summary from admission at [facility] from 12/02/21 - 01/03/2022, noted PUD in setting of hematemesis and hematochezia. After EGD sh was noted to have ulcerations and started on pantoprazole BID x 30 days then transition to daily dosing. This was sent to the patient's pharmacy and I have encouraged her to keep follow-up with [facility].

## 2022-01-20 NOTE — Assessment & Plan Note (Signed)
Chronic, diagnosed during childhood, managed by [facility]. Will follow peripherally.

## 2022-03-07 ENCOUNTER — Other Ambulatory Visit: Payer: Self-pay | Admitting: Family Medicine

## 2022-03-07 DIAGNOSIS — K279 Peptic ulcer, site unspecified, unspecified as acute or chronic, without hemorrhage or perforation: Secondary | ICD-10-CM

## 2022-03-08 NOTE — Telephone Encounter (Signed)
Rx was sent to pharmacy on 01/20/22 #30/2. Pt has FU appt on 04/22/21.  Requested Prescriptions  Refused Prescriptions Disp Refills   pantoprazole (PROTONIX) 40 MG tablet [Pharmacy Med Name: PANTOPRAZOLE SOD DR 40 MG TAB] 180 tablet     Sig: TAKE 1 TABLET BY MOUTH TWICE A DAY     Gastroenterology: Proton Pump Inhibitors Passed - 03/07/2022  9:10 AM      Passed - Valid encounter within last 12 months    Recent Outpatient Visits           1 month ago Exocrine pancreatic insufficiency   [facility] [place], MD       Future Appointments             In 1 month Zigmund Daniel, Earley Abide, MD [place] [facility], [facility]

## 2022-04-22 ENCOUNTER — Encounter: Payer: Medicaid Other | Admitting: Family Medicine

## 2022-05-01 ENCOUNTER — Encounter: Payer: Self-pay | Admitting: Family Medicine

## 2022-05-01 ENCOUNTER — Ambulatory Visit (INDEPENDENT_AMBULATORY_CARE_PROVIDER_SITE_OTHER): Payer: Medicaid Other | Admitting: Family Medicine

## 2022-05-01 VITALS — BP 110/70 | HR 90 | Wt 126.0 lb

## 2022-05-01 DIAGNOSIS — E559 Vitamin D deficiency, unspecified: Secondary | ICD-10-CM

## 2022-05-01 DIAGNOSIS — K269 Duodenal ulcer, unspecified as acute or chronic, without hemorrhage or perforation: Secondary | ICD-10-CM

## 2022-05-01 DIAGNOSIS — Z0000 Encounter for general adult medical examination without abnormal findings: Secondary | ICD-10-CM

## 2022-05-01 DIAGNOSIS — K900 Celiac disease: Secondary | ICD-10-CM

## 2022-05-01 DIAGNOSIS — F419 Anxiety disorder, unspecified: Secondary | ICD-10-CM | POA: Insufficient documentation

## 2022-05-01 DIAGNOSIS — K279 Peptic ulcer, site unspecified, unspecified as acute or chronic, without hemorrhage or perforation: Secondary | ICD-10-CM

## 2022-05-01 DIAGNOSIS — L570 Actinic keratosis: Secondary | ICD-10-CM | POA: Insufficient documentation

## 2022-05-01 DIAGNOSIS — K581 Irritable bowel syndrome with constipation: Secondary | ICD-10-CM

## 2022-05-01 DIAGNOSIS — Z13220 Encounter for screening for lipoid disorders: Secondary | ICD-10-CM

## 2022-05-01 DIAGNOSIS — K219 Gastro-esophageal reflux disease without esophagitis: Secondary | ICD-10-CM

## 2022-05-01 NOTE — Patient Instructions (Addendum)
-   Obtain fasting labs with orders provided (can have water or black coffee but otherwise no food or drink x 8 hours before labs) - Review information provided - Attend eye doctor annually, dentist every 6 months, work towards or maintain 30 minutes of moderate intensity physical activity at least 5 days per week, and consume a balanced diet - Return in 4 months - Contact us for any questions between now and then  Additionally: - Check in with [facility] gastroenterology for routine follow-up - Referral coordinator will contact you to schedule visit with psychiatry - Check in with your gynecologist/have them send records of cervical cancer screening (Pap exam) - Check in with your dermatologist for annual skin check

## 2022-05-01 NOTE — Assessment & Plan Note (Signed)
Noted recently by patient's partner, is currently established with a dermatologist.  I have encouraged patient to touch base with them regarding annual skin check for further evaluation and management of this.

## 2022-05-02 DIAGNOSIS — K589 Irritable bowel syndrome without diarrhea: Secondary | ICD-10-CM | POA: Insufficient documentation

## 2022-05-02 DIAGNOSIS — K900 Celiac disease: Secondary | ICD-10-CM | POA: Insufficient documentation

## 2022-05-02 DIAGNOSIS — K219 Gastro-esophageal reflux disease without esophagitis: Secondary | ICD-10-CM | POA: Insufficient documentation

## 2022-05-02 LAB — CBC WITH DIFFERENTIAL/PLATELET
Basophils Absolute: 0 10*3/uL (ref 0.0–0.2)
Basos: 1 %
EOS (ABSOLUTE): 0 10*3/uL (ref 0.0–0.4)
Eos: 0 %
Hematocrit: 25.6 % — ABNORMAL LOW (ref 34.0–46.6)
Hemoglobin: 9 g/dL — ABNORMAL LOW (ref 11.1–15.9)
Immature Grans (Abs): 0 10*3/uL (ref 0.0–0.1)
Immature Granulocytes: 0 %
Lymphocytes Absolute: 0.7 10*3/uL (ref 0.7–3.1)
Lymphs: 22 %
MCH: 38.6 pg — ABNORMAL HIGH (ref 26.6–33.0)
MCHC: 35.2 g/dL (ref 31.5–35.7)
MCV: 110 fL — ABNORMAL HIGH (ref 79–97)
Monocytes Absolute: 0.7 10*3/uL (ref 0.1–0.9)
Monocytes: 21 %
Neutrophils Absolute: 1.7 10*3/uL (ref 1.4–7.0)
Neutrophils: 56 %
Platelets: 312 10*3/uL (ref 150–450)
RBC: 2.33 x10E6/uL — CL (ref 3.77–5.28)
RDW: 12.1 % (ref 11.7–15.4)
WBC: 3.1 10*3/uL — ABNORMAL LOW (ref 3.4–10.8)

## 2022-05-02 LAB — LIPID PANEL
Chol/HDL Ratio: 1.6 ratio (ref 0.0–4.4)
Cholesterol, Total: 241 mg/dL — ABNORMAL HIGH (ref 100–199)
HDL: 148 mg/dL (ref >39)
LDL Chol Calc (NIH): 83 mg/dL (ref 0–99)
Triglycerides: 63 mg/dL (ref 0–149)
VLDL Cholesterol Cal: 10 mg/dL (ref 5–40)

## 2022-05-02 LAB — COMPREHENSIVE METABOLIC PANEL
ALT: 41 IU/L — ABNORMAL HIGH (ref 0–32)
AST: 144 IU/L — ABNORMAL HIGH (ref 0–40)
Albumin/Globulin Ratio: 1.8 (ref 1.2–2.2)
Albumin: 4.6 g/dL (ref 3.9–4.9)
Alkaline Phosphatase: 165 IU/L — ABNORMAL HIGH (ref 44–121)
BUN/Creatinine Ratio: 22 (ref 9–23)
BUN: 11 mg/dL (ref 6–24)
Bilirubin Total: <0.2 mg/dL (ref 0.0–1.2)
CO2: 20 mmol/L (ref 20–29)
Calcium: 9.5 mg/dL (ref 8.7–10.2)
Chloride: 94 mmol/L — ABNORMAL LOW (ref 96–106)
Creatinine, Ser: 0.5 mg/dL — ABNORMAL LOW (ref 0.57–1.00)
Globulin, Total: 2.6 g/dL (ref 1.5–4.5)
Glucose: 113 mg/dL — ABNORMAL HIGH (ref 70–99)
Potassium: 5.5 mmol/L — ABNORMAL HIGH (ref 3.5–5.2)
Sodium: 133 mmol/L — ABNORMAL LOW (ref 134–144)
Total Protein: 7.2 g/dL (ref 6.0–8.5)
eGFR: 118 mL/min/{1.73_m2} (ref >59)

## 2022-05-02 LAB — HEPATITIS C ANTIBODY: Hep C Virus Ab: NONREACTIVE

## 2022-05-02 LAB — VITAMIN D 25 HYDROXY (VIT D DEFICIENCY, FRACTURES): Vit D, 25-Hydroxy: 9.7 ng/mL — ABNORMAL LOW (ref 30.0–100.0)

## 2022-05-02 LAB — TSH: TSH: 0.511 u[IU]/mL (ref 0.450–4.500)

## 2022-05-02 NOTE — Assessment & Plan Note (Signed)
Followed by [facility] gastroenterology, recent admission for melena and hematochezia, no transfusions needed, PPI therapy advised. Currently asymptomatic, no GI follow-up noted, encouraged patient to coordinate visit.

## 2022-05-02 NOTE — Assessment & Plan Note (Signed)
Followed by [facility], started on Bentyl after recent discharge, still symptomatic with some interval improvement in bowel reported.

## 2022-05-02 NOTE — Progress Notes (Signed)
Annual Physical Exam Visit  Patient Information:  Patient ID: Crystal Mckinney, female DOB: 03/25/1975 Age: 47 y.o. MRN: [id number]   Subjective:   CC: Annual Physical Exam  HPI:  Crystal Mckinney is here for their annual physical.  I reviewed the past medical history, family history, social history, surgical history, and allergies today and changes were made as necessary.  Please see the problem list section below for additional details.  Past Medical History: Past Medical History:  Diagnosis Date   ADD (attention deficit disorder)    Anxiety    Celiac disease    GERD (gastroesophageal reflux disease)    IBS (irritable bowel syndrome)    Malnutrition (HCC)    Seizures (HCC)    Past Surgical History: Past Surgical History:  Procedure Laterality Date   ABDOMINAL SURGERY     HEMATOMA EVACUATION     HIP SURGERY Left    Family History: Family History  Problem Relation Age of Onset   Healthy Mother    Hyperlipidemia Father    Allergies: Allergies  Allergen Reactions   Dilantin [Phenytoin] Anaphylaxis   Penicillins Anaphylaxis   Gluten Meal     "I have celiac"   Health Maintenance: Health Maintenance  Topic Date Due   PAP SMEAR-Modifier  Never done   COVID-19 Vaccine (4 - 2023-24 season) 11/07/2021   COLONOSCOPY (Pts 45-52yr Insurance coverage will need to be confirmed)  Never done   DTaP/Tdap/Td (4 - Td or Tdap) 11/14/2031   INFLUENZA VACCINE  Completed   Hepatitis C Screening  Completed   HIV Screening  Completed   HPV VACCINES  Aged Out    HM Colonoscopy          Overdue - COLONOSCOPY (Pts 45-44yrInsurance coverage will need to be confirmed) (Every 10 Years) Overdue - never done    No completion history exists for this topic.           Medications: Current Outpatient Medications on File Prior to Visit  Medication Sig Dispense Refill   carbamazepine (TEGRETOL XR) 200 MG 12 hr tablet Take 200 mg by mouth 2 (two) times daily. 2 tablets in morning  and 2 tablets at night.     cloBAZam (ONFI) 10 MG tablet Take 10 mg by mouth at bedtime.     levonorgestrel (MIRENA) 20 MCG/24HR IUD 1 each by Intrauterine route once.     lipase/protease/amylase (CREON) 36000 UNITS CPEP capsule 2 capsules by mouth three times a day with meals, 1 capsule by mouth as needed with snacks 180 capsule 2   Multiple Vitamins-Minerals (MULTIVITAMIN WITH MINERALS) tablet Take 1 tablet by mouth daily.     pantoprazole (PROTONIX) 40 MG tablet Take 1 tablet (40 mg total) by mouth daily. 30 tablet 2   Venlafaxine HCl (EFFEXOR XR PO) Take 75 mg by mouth daily.     [DISCONTINUED] escitalopram (LEXAPRO) 20 MG tablet Take 1 tablet by mouth daily.     [DISCONTINUED] lamoTRIgine (LAMICTAL) 100 MG tablet Take 200 mg by mouth 2 (two) times daily.      [DISCONTINUED] levETIRAcetam (KEPPRA) 500 MG tablet Take 1,500 mg by mouth 2 (two) times daily.      [DISCONTINUED] omeprazole (PRILOSEC) 20 MG capsule Take 1 capsule (20 mg total) by mouth daily. 30 capsule 0   No current facility-administered medications on file prior to visit.    Review of Systems: No headache, visual changes, nausea, vomiting, diarrhea, +constipation, dizziness, abdominal pain, skin rash, fevers, chills, night  sweats, swollen lymph nodes, weight loss, chest pain, body aches, joint swelling, muscle aches, shortness of breath, mood changes, visual or auditory hallucinations reported.  Objective:   Vitals:   05/01/22 0759  BP: 110/70  Pulse: 90  SpO2: 97%   Vitals:   05/01/22 0759  Weight: 126 lb (57.2 kg)   Body mass index is 20.97 kg/m.  General: Well Developed, well nourished, and in no acute distress.  Neuro: Alert and oriented x3, extra-ocular muscles intact, sensation grossly intact. Cranial nerves II through XII are grossly intact, motor, sensory, and coordinative functions are intact. HEENT: Normocephalic, atraumatic, pupils equal round reactive to light, neck supple, no masses, no  lymphadenopathy, thyroid nonpalpable. Oropharynx, nasopharynx, external ear canals are unremarkable. Skin: Warm and dry, no rashes noted.  Cardiac: Regular rate and rhythm, no murmurs rubs or gallops. No peripheral edema. Pulses symmetric. Respiratory: Clear to auscultation bilaterally. Not using accessory muscles, speaking in full sentences.  Abdominal: Soft, nontender, nondistended, positive bowel sounds, no masses, no organomegaly. Musculoskeletal: Shoulder, elbow, wrist, hip, knee, ankle stable, and with full range of motion.  Female chaperone initials: AH present throughout the physical examination.  Impression and Recommendations:   The patient was counselled, risk factors were discussed, and anticipatory guidance given.  Problem List Items Addressed This Visit       Digestive   PUD (peptic ulcer disease)    Followed by [facility] gastroenterology, recent admission for melena and hematochezia, no transfusions needed, PPI therapy advised. Currently asymptomatic, no GI follow-up noted, encouraged patient to coordinate visit.      IBS (irritable bowel syndrome)    Followed by [facility], started on Bentyl after recent discharge, still symptomatic with some interval improvement in bowel reported.      GERD (gastroesophageal reflux disease)   Celiac disease     Musculoskeletal and Integument   Actinic keratoses    Noted recently by patient's partner, is currently established with a dermatologist.  I have encouraged patient to touch base with them regarding annual skin check for further evaluation and management of this.        Other   Anxiety and depression    Still symptomatic despite ongoing pharmacotherapy and previous trials of alternate SSRI/SNRIs. Referral placed to psychiatry for further evaluation and management.      Relevant Orders   Ambulatory referral to Psychiatry   Annual physical exam - Primary    Annual examination completed, risk stratification labs ordered,  anticipatory guidance provided.  We will follow labs once resulted.      Relevant Orders   Hepatitis C antibody (Completed)   Lipid panel (Completed)   TSH (Completed)   VITAMIN D 25 Hydroxy (Vit-D Deficiency, Fractures) (Completed)   Comprehensive metabolic panel (Completed)   CBC with Differential/Platelet (Completed)   Other Visit Diagnoses     Vitamin D deficiency       Relevant Orders   VITAMIN D 25 Hydroxy (Vit-D Deficiency, Fractures) (Completed)   Screening for lipoid disorders       Relevant Orders   Lipid panel (Completed)   Comprehensive metabolic panel (Completed)   Duodenal ulcer            Orders & Medications Medications: No orders of the defined types were placed in this encounter.  Orders Placed This Encounter  Procedures   Hepatitis C antibody   Lipid panel   TSH   VITAMIN D 25 Hydroxy (Vit-D Deficiency, Fractures)   Comprehensive metabolic panel   CBC with Differential/Platelet  Ambulatory referral to Psychiatry     Return in about 4 months (around 08/30/2022) for f/u .    Montel Culver, MD, [facility]   Primary Care Sports Medicine Primary [facility] and [facility] Medicine at [facility]

## 2022-05-02 NOTE — Assessment & Plan Note (Signed)
Still symptomatic despite ongoing pharmacotherapy and previous trials of alternate SSRI/SNRIs. Referral placed to psychiatry for further evaluation and management.

## 2022-05-02 NOTE — Assessment & Plan Note (Signed)
Annual examination completed, risk stratification labs ordered, anticipatory guidance provided.  We will follow labs once resulted. 

## 2022-05-05 ENCOUNTER — Other Ambulatory Visit: Payer: Self-pay | Admitting: Family Medicine

## 2022-05-05 MED ORDER — VITAMIN D (ERGOCALCIFEROL) 1.25 MG (50000 UNIT) PO CAPS: 50000.0000 [IU] | ORAL_CAPSULE | ORAL | 0 refills | Status: DC

## 2022-05-05 NOTE — Progress Notes (Signed)
Please help patient schedule GI follow-up (she sees Crystal Mckinney) as she had a recent [facility] stay for GI bleed and noted have anemia on recent blood work.  Please also add A1c for elevated fasting blood sugar

## 2022-05-05 NOTE — Progress Notes (Signed)
A1C added R73.09.  KP

## 2022-05-07 LAB — SPECIMEN STATUS REPORT

## 2022-05-07 LAB — HGB A1C W/O EAG: Hgb A1c MFr Bld: 4.3 % — ABNORMAL LOW (ref 4.8–5.6)

## 2022-05-11 NOTE — Progress Notes (Signed)
Pt called and a mychart message was previously sent. Pt has not reached out.  KP

## 2022-07-06 ENCOUNTER — Telehealth: Payer: Self-pay

## 2022-07-06 NOTE — Transitions of Care (Post Inpatient/ED Visit) (Signed)
   07/08/2022  Name: Crystal Mckinney MRN: [id number] DOB: [birth year]  Today's TOC FU Call Status: Today's TOC FU Call Status:: Unsuccessful Call (3rd Attempt) Unsuccessful Call (1st Attempt) Date: 07/06/22 Unsuccessful Call (2nd Attempt) Date: 07/07/22 Unsuccessful Call (3rd Attempt) Date: 07/08/22  Attempted to reach the patient regarding the most recent Inpatient/ED visit.  Follow Up Plan: No further outreach attempts will be made at this time. We have been unable to contact the patient.  Signature  Fredirick Maudlin [facility]

## 2022-08-03 ENCOUNTER — Other Ambulatory Visit: Payer: Self-pay | Admitting: Family Medicine

## 2022-08-05 NOTE — Telephone Encounter (Signed)
Requested medication (s) are due for refill today: yes  Requested medication (s) are on the active medication list: yes  Last refill:  05/05/22 #8  Future visit scheduled:yes  Notes to clinic:  med not delegated to NT to RF   Requested Prescriptions  Pending Prescriptions Disp Refills   Vitamin D, Ergocalciferol, (DRISDOL) 1.25 MG (50000 UNIT) CAPS capsule [Pharmacy Med Name: VITAMIN D2 1.25MG (50,000 UNIT)] 8 capsule 0    Sig: Take 1 capsule (50,000 Units total) by mouth every 7 (seven) days. Take for 8 total doses(weeks)     Endocrinology:  Vitamins - Vitamin D Supplementation 2 Failed - 08/03/2022  7:05 PM      Failed - Manual Review: Route requests for 50,000 IU strength to the provider      Failed - Vitamin D in normal range and within 360 days    Vit D, 25-Hydroxy  Date Value Ref Range Status  05/01/2022 9.7 (L) 30.0 - 100.0 ng/mL Final    Comment:    Vitamin D deficiency has been defined by the Institute of Medicine and an Endocrine Society practice guideline as a level of serum 25-OH vitamin D less than 20 ng/mL (1,2). The Endocrine Society went on to further define vitamin D insufficiency as a level between 21 and 29 ng/mL (2). 1. IOM (Institute of Medicine). 2010. Dietary reference    intakes for calcium and D. [place]: The    [employer]. 2. Holick MF, Binkley Bode, Bischoff-Ferrari HA, et al.    Evaluation, treatment, and prevention of vitamin D    deficiency: an Endocrine Society clinical practice    guideline. JCEM. 2011 Jul; 96(7):1911-30.          Passed - Ca in normal range and within 360 days    Calcium  Date Value Ref Range Status  05/01/2022 9.5 8.7 - 10.2 mg/dL Final         Passed - Valid encounter within last 12 months    Recent Outpatient Visits           3 months ago Annual physical exam   [facility] at [facility], Ocie Bob, MD   6 months ago Exocrine pancreatic insufficiency    [place] [facility] at [facility], Ocie Bob, MD       Future Appointments             In 3 weeks Ashley Royalty, Ocie Bob, MD [facility] Primary Care & Sports Medicine at [facility], [facility]

## 2022-08-24 ENCOUNTER — Other Ambulatory Visit: Payer: Self-pay | Admitting: Family Medicine

## 2022-08-24 DIAGNOSIS — K279 Peptic ulcer, site unspecified, unspecified as acute or chronic, without hemorrhage or perforation: Secondary | ICD-10-CM

## 2022-08-24 NOTE — Telephone Encounter (Signed)
Requested Prescriptions  Pending Prescriptions Disp Refills   pantoprazole (PROTONIX) 40 MG tablet [Pharmacy Med Name: PANTOPRAZOLE SOD DR 40 MG TAB] 90 tablet 2    Sig: TAKE 1 TABLET BY MOUTH EVERY DAY     Gastroenterology: Proton Pump Inhibitors Passed - 08/24/2022  2:26 AM      Passed - Valid encounter within last 12 months    Recent Outpatient Visits           3 months ago Annual physical exam   [facility] & [facility] Medicine at [facility], Ocie Bob, MD   7 months ago Exocrine pancreatic insufficiency   [place] [facility] & [facility] Medicine at [facility], Ocie Bob, MD       Future Appointments             In 1 week Ashley Royalty, Ocie Bob, MD [facility] Primary Care & Sports Medicine at [facility], [facility]

## 2022-08-26 ENCOUNTER — Ambulatory Visit
Admission: RE | Admit: 2022-08-26 | Discharge: 2022-08-26 | Payer: Medicaid Other | Source: Ambulatory Visit | Attending: Family Medicine | Admitting: Family Medicine

## 2022-08-26 VITALS — BP 114/68 | HR 115 | Temp 98.5°F

## 2022-08-26 DIAGNOSIS — R55 Syncope and collapse: Secondary | ICD-10-CM

## 2022-08-26 DIAGNOSIS — R7401 Elevation of levels of liver transaminase levels: Secondary | ICD-10-CM

## 2022-08-26 DIAGNOSIS — K922 Gastrointestinal hemorrhage, unspecified: Secondary | ICD-10-CM

## 2022-08-26 DIAGNOSIS — E871 Hypo-osmolality and hyponatremia: Secondary | ICD-10-CM

## 2022-08-26 LAB — CBC WITH DIFFERENTIAL/PLATELET
Abs Immature Granulocytes: 0.02 10*3/uL (ref 0.00–0.07)
Basophils Absolute: 0 10*3/uL (ref 0.0–0.1)
Basophils Relative: 0 %
Eosinophils Absolute: 0 10*3/uL (ref 0.0–0.5)
Eosinophils Relative: 0 %
HCT: 16 % — ABNORMAL LOW (ref 36.0–46.0)
Hemoglobin: 5.2 g/dL — ABNORMAL LOW (ref 12.0–15.0)
Immature Granulocytes: 0 %
Lymphocytes Relative: 19 %
Lymphs Abs: 0.9 10*3/uL (ref 0.7–4.0)
MCH: 34 pg (ref 26.0–34.0)
MCHC: 32.5 g/dL (ref 30.0–36.0)
MCV: 104.6 fL — ABNORMAL HIGH (ref 80.0–100.0)
Monocytes Absolute: 1 10*3/uL (ref 0.1–1.0)
Monocytes Relative: 19 %
Neutro Abs: 3.1 10*3/uL (ref 1.7–7.7)
Neutrophils Relative %: 62 %
Platelets: 314 10*3/uL (ref 150–400)
RBC: 1.53 MIL/uL — ABNORMAL LOW (ref 3.87–5.11)
RDW: 13.2 % (ref 11.5–15.5)
WBC: 5 10*3/uL (ref 4.0–10.5)
nRBC: 0 % (ref 0.0–0.2)

## 2022-08-26 LAB — COMPREHENSIVE METABOLIC PANEL
ALT: 36 U/L (ref 0–44)
AST: 129 U/L — ABNORMAL HIGH (ref 15–41)
Albumin: 4 g/dL (ref 3.5–5.0)
Alkaline Phosphatase: 156 U/L — ABNORMAL HIGH (ref 38–126)
Anion gap: 16 — ABNORMAL HIGH (ref 5–15)
BUN: 13 mg/dL (ref 6–20)
CO2: 17 mmol/L — ABNORMAL LOW (ref 22–32)
Calcium: 8.7 mg/dL — ABNORMAL LOW (ref 8.9–10.3)
Chloride: 98 mmol/L (ref 98–111)
Creatinine, Ser: 0.76 mg/dL (ref 0.44–1.00)
GFR, Estimated: >60 mL/min (ref >60)
Glucose, Bld: 123 mg/dL — ABNORMAL HIGH (ref 70–99)
Potassium: 4.3 mmol/L (ref 3.5–5.1)
Sodium: 131 mmol/L — ABNORMAL LOW (ref 135–145)
Total Bilirubin: 0.2 mg/dL — ABNORMAL LOW (ref 0.3–1.2)
Total Protein: 7.7 g/dL (ref 6.5–8.1)

## 2022-08-26 LAB — LIPASE, BLOOD: Lipase: 38 U/L (ref 11–51)

## 2022-08-26 NOTE — Discharge Instructions (Addendum)
Please go to the emergency department at [facility] for evaluation of your low H&H and to have your GI bleed further evaluated.

## 2022-08-26 NOTE — ED Provider Notes (Addendum)
[facility]    CSN: [id number] Arrival date & time: 08/26/22  1217      History   Chief Complaint Chief Complaint  Patient presents with   Cough   Fatigue   Loss of Consciousness    HPI Crystal Mckinney is a 47 y.o. female.   HPI  48 year old female with a past medical history significant for IBS, PUD, duodenal ulcer, GERD, celiac disease, anxiety, ADD, and epilepsy presents for evaluation of fatigue and weakness for the last 2 weeks.  She has had 2 syncopal events 1 was here at the urgent care when she was called back to the room and the 1 previous was 3 days ago.  She reports that for the last 2 weeks she has felt dizzy when she is changing position.  She has had some lower abdominal pain which is different than her chronic pancreatitis.  She also reports that she has had black diarrhea stools for the last 2 weeks typically 2-3 a day.  She did have a large black stool in the waiting room.  She is quite pale in appearance and states that she is paler than normal.  In October of last year she was hospitalized for a GI bleed which had bright red blood per rectum.  Past Medical History:  Diagnosis Date   ADD (attention deficit disorder)    Anxiety    Celiac disease    GERD (gastroesophageal reflux disease)    IBS (irritable bowel syndrome)    Malnutrition (HCC)    Seizures (HCC)     Patient Active Problem List   Diagnosis Date Noted   Celiac disease 05/02/2022   GERD (gastroesophageal reflux disease) 05/02/2022   IBS (irritable bowel syndrome) 05/02/2022   Anxiety and depression 05/01/2022   Annual physical exam 05/01/2022   Actinic keratoses 05/01/2022   Exocrine pancreatic insufficiency 01/20/2022   PUD (peptic ulcer disease) 01/20/2022   Seizures (HCC) 01/20/2022    Past Surgical History:  Procedure Laterality Date   ABDOMINAL SURGERY     HEMATOMA EVACUATION     HIP SURGERY Left     OB History   No obstetric history on file.      Home  Medications    Prior to Admission medications   Medication Sig Start Date End Date Taking? Authorizing Provider  bisacodyl (DULCOLAX) 10 MG suppository Place rectally. 07/03/22  Yes [provider]  carbamazepine (TEGRETOL XR) 200 MG 12 hr tablet Take 200 mg by mouth 2 (two) times daily. 2 tablets in morning and 2 tablets at night.   Yes [provider]  cloBAZam (ONFI) 10 MG tablet Take 10 mg by mouth at bedtime.   Yes [provider]  dicyclomine (BENTYL) 10 MG capsule Take 10 mg by mouth 3 (three) times daily. 07/03/22  Yes [provider]  levonorgestrel (MIRENA) 20 MCG/24HR IUD 1 each by Intrauterine route once.   Yes [provider]  lipase/protease/amylase (CREON) 36000 UNITS CPEP capsule 2 capsules by mouth three times a day with meals, 1 capsule by mouth as needed with snacks 01/20/22  Yes Jerrol Banana, MD  Multiple Vitamins-Minerals (MULTIVITAMIN WITH MINERALS) tablet Take 1 tablet by mouth daily.   Yes [provider]  pantoprazole (PROTONIX) 40 MG tablet TAKE 1 TABLET BY MOUTH EVERY DAY 08/24/22  Yes Jerrol Banana, MD  Venlafaxine HCl (EFFEXOR XR PO) Take 75 mg by mouth daily.   Yes [provider]  Vitamin D, Ergocalciferol, (DRISDOL) 1.25 MG (  50000 UNIT) CAPS capsule Take 1 capsule (50,000 Units total) by mouth every 7 (seven) days. Take for 8 total doses(weeks) 05/05/22  Yes Jerrol Banana, MD  cyanocobalamin (VITAMIN B12) 1000 MCG/ML injection Inject 1,000 mcg into the muscle every 30 (thirty) days.    [provider]  escitalopram (LEXAPRO) 20 MG tablet Take 1 tablet by mouth daily. 02/28/18 08/26/19  [provider]  lamoTRIgine (LAMICTAL) 100 MG tablet Take 200 mg by mouth 2 (two) times daily.  01/15/18 08/26/19  [provider]  levETIRAcetam (KEPPRA) 500 MG tablet Take 1,500 mg by mouth 2 (two) times daily.  01/15/18 08/26/19  [provider]  omeprazole (PRILOSEC) 20 MG  capsule Take 1 capsule (20 mg total) by mouth daily. 04/24/18 08/26/19  Katha Hamming, MD    Family History Family History  Problem Relation Age of Onset   Healthy Mother    Hyperlipidemia Father     Social History Social History   Tobacco Use   Smoking status: Never   Smokeless tobacco: Never  Vaping Use   Vaping Use: Never used  Substance Use Topics   Alcohol use: Not Currently    Comment: social   Drug use: Never     Allergies   Dilantin [phenytoin], Penicillins, and Gluten meal   Review of Systems Review of Systems  Constitutional:  Negative for fever.  Gastrointestinal:  Positive for abdominal distention, abdominal pain, blood in stool, diarrhea, nausea and vomiting. Negative for anal bleeding.       Worse in her abdomen feels distended today.  She is also had intermittent vomiting on the order of 2-3 times a week which she relates to GERD.  Neurological:  Positive for dizziness and syncope.     Physical Exam Triage Vital Signs ED Triage Vitals  Enc Vitals Group     BP      Pulse      Resp      Temp      Temp src      SpO2      Weight      Height      Head Circumference      Peak Flow      Pain Score      Pain Loc      Pain Edu?      Excl. in GC?    No data found.  Updated Vital Signs BP 114/68 (BP Location: Right Arm)   Pulse (!) 115   Temp 98.5 F (36.9 C) (Oral)   SpO2 100%   Visual Acuity Right Eye Distance:   Left Eye Distance:   Bilateral Distance:    Right Eye Near:   Left Eye Near:    Bilateral Near:     Physical Exam Vitals and nursing note reviewed.  Constitutional:      Appearance: Normal appearance. She is not ill-appearing.  HENT:     Head: Normocephalic and atraumatic.     Mouth/Throat:     Mouth: Mucous membranes are moist.     Pharynx: Oropharynx is clear.     Comments: Oral mucous membranes are Eyes:     Extraocular Movements: Extraocular movements intact.     Pupils: Pupils are equal, round, and  reactive to light.     Comments: Conjunctiva are pale bilaterally.  Cardiovascular:     Rate and Rhythm: Normal rate and regular rhythm.     Pulses: Normal pulses.     Heart sounds: Normal heart sounds. No murmur heard.  No friction rub. No gallop.  Pulmonary:     Effort: Pulmonary effort is normal.     Breath sounds: Normal breath sounds. No wheezing, rhonchi or rales.  Abdominal:     General: Abdomen is flat.     Palpations: Abdomen is soft.     Tenderness: There is abdominal tenderness. There is no guarding or rebound.     Comments: Some epigastric tenderness but mostly lower abdominal tenderness.  No guarding or rebound.  Skin:    General: Skin is warm and dry.     Capillary Refill: Capillary refill takes less than 2 seconds.     Coloration: Skin is pale.  Neurological:     General: No focal deficit present.     Mental Status: She is alert and oriented to person, place, and time.      UC Treatments / Results  Labs (all labs ordered are listed, but only abnormal results are displayed) Labs Reviewed  CBC WITH DIFFERENTIAL/PLATELET - Abnormal; Notable for the following components:      Result Value   RBC 1.53 (*)    Hemoglobin 5.2 (*)    HCT 16.0 (*)    MCV 104.6 (*)    All other components within normal limits  COMPREHENSIVE METABOLIC PANEL - Abnormal; Notable for the following components:   Sodium 131 (*)    CO2 17 (*)    Glucose, Bld 123 (*)    Calcium 8.7 (*)    AST 129 (*)    Alkaline Phosphatase 156 (*)    Total Bilirubin 0.2 (*)    Anion gap 16 (*)    All other components within normal limits  LIPASE, BLOOD    EKG Sinus tachycardia with a ventricular rate of 101 bpm PR interval 120 ms QRS duration 84 ms QT/QTc 324/420 ms Possible left atrial enlargement, T wave abnormality, consider inferior lateral ischemia  Radiology No results found.  Procedures Procedures (including critical care time)  Medications Ordered in UC Medications - No data to  display  Initial Impression / Assessment and Plan / UC Course  I have reviewed the triage vital signs and the nursing notes.  Pertinent labs & imaging results that were available during my care of the patient were reviewed by me and considered in my medical decision making (see chart for details).   The patient is a pleasant 47 year old female presenting for evaluation of fatigue and syncope as outlined in HPI above.  She also endorses that she has had a mild cough but she is not coughing in the exam room.  She does have a history of GI bleed secondary to duodenal and peptic ulcer disease.  She was hospitalized in October of last year (2023) at that time had bright red blood per rectum.  She denies any bright red blood per rectum or blood when she wipes but states that her stools have been diarrhea type on the order of 2-3 a day that are black in color.  The patient is concerned that she is having internal bleeding and she came to the urgent care requesting blood work.  She is mildly tachycardic with a heart rate of 115 but her blood pressure is normal at 114/68.  100% room air oxygen saturation.  She is afebrile at 98.5.  Patient does have pale skin, pale conjunctiva, and pale oral mucous membranes.  Heart sounds are S1-S2 with regular rate and rhythm and lung sounds are clear to auscultation.  Patient's abdomen is soft with epigastric and  bilateral lower abdominal tenderness.  No guarding or rebound.  She does have a history of chronic pancreatitis and takes Creon but she states that her abdominal pain is different than pancreatitis type pain.  Given her episodes of syncope and her dark stools I am concerned that she is experiencing GI bleeding.  I will check CBC and CMP.  I will also check a lipase given that she has a history of chronic pancreatitis.  Additionally, I will order an EKG to look for any cardiac abnormalities which could be causing her syncope.  Patient's EKG shows sinus tachycardia with T  wave inversions in lead II, III, and aVF with T wave inversions in V4, V5, and V6.  There were no other tracings available for comparison in epic, however, EKG impression from [facility] on 12/03/2021 shows T wave inversions in the lateral leads.  CBC shows an H&H of 5.2 and 16.  MCV is 104.6.  RBC count is 1.53.  WBC count is normal.  CMP shows mild hyponatremia with a sodium of 131.  BUN and creatinine are normal.  Calcium is mildly low at 8.7.  AST and alk phos are both elevated.  This is most likely secondary to her GI bleed.  Given patient's low H&H I am concerned that she has another GI bleed.  She is requesting to go to [facility] and states that her father will drive her.  She refuses to go to [facility].  I called and spoke to Curly Shores at [facility], she is the Facilities manager of the ER to notify them of the patient's condition and give report.   Final Clinical Impressions(s) / UC Diagnoses   Final diagnoses:  Syncope and collapse  Gastrointestinal hemorrhage, unspecified gastrointestinal hemorrhage type  Hyponatremia  Transaminitis     Discharge Instructions      Please go to the emergency department at [facility] for evaluation of your low H&H and to have your GI bleed further evaluated.     ED Prescriptions   None    PDMP not reviewed this encounter.   Becky Augusta, NP 08/26/22 1337    Becky Augusta, NP 08/26/22 1343

## 2022-08-26 NOTE — ED Triage Notes (Signed)
Pt presents to UC c/o cough, fatigue, weakness onset x2 weeks.   Called patient to lobby and patient had syncopal episode that lasted about 30 seconds. Leaned patient against wall to slide down onto ground. Pt did not hit head. Pt states this has been happening pretty frequently and concerned for low hemoglobin.

## 2022-08-26 NOTE — ED Notes (Signed)
Patient is being discharged from the Urgent Care and sent to the Emergency Department via personal vehicle . Per Berneta Sages, RN, patient is in need of higher level of care due to syncope, low hemoglobin. Patient is aware and verbalizes understanding of plan of care.  Vitals:   08/26/22 1257  BP: 114/68  Pulse: (!) 115  Temp: 98.5 F (36.9 C)  SpO2: 100%

## 2022-08-31 ENCOUNTER — Ambulatory Visit: Payer: Medicaid Other | Admitting: Family Medicine

## 2022-09-01 ENCOUNTER — Encounter: Payer: Self-pay | Admitting: Family Medicine

## 2022-09-07 ENCOUNTER — Telehealth: Payer: Self-pay

## 2022-09-07 NOTE — Transitions of Care (Post Inpatient/ED Visit) (Unsigned)
   09/07/2022  Name: Crystal Mckinney MRN: [id number] DOB: [birth year]  Today's TOC FU Call Status: Today's TOC FU Call Status:: Unsuccessul Call (1st Attempt) Unsuccessful Call (1st Attempt) Date: 09/07/22  Attempted to reach the patient regarding the most recent Inpatient/ED visit.  Follow Up Plan: Additional outreach attempts will be made to reach the patient to complete the Transitions of Care (Post Inpatient/ED visit) call.   Signature Karena Addison, LPN [facility] Nurse Health Advisor Direct Dial [phone]

## 2022-09-08 NOTE — Transitions of Care (Post Inpatient/ED Visit) (Unsigned)
   09/08/2022  Name: Crystal Mckinney MRN: [id number] DOB: [birth year]  Today's TOC FU Call Status: Today's TOC FU Call Status:: Unsuccessful Call (2nd Attempt) Unsuccessful Call (1st Attempt) Date: 09/07/22 Unsuccessful Call (2nd Attempt) Date: 09/08/22  Attempted to reach the patient regarding the most recent Inpatient/ED visit.  Follow Up Plan: Additional outreach attempts will be made to reach the patient to complete the Transitions of Care (Post Inpatient/ED visit) call.   Signature Karena Addison, LPN [facility] Nurse Health Advisor Direct Dial [phone]

## 2022-09-09 NOTE — Transitions of Care (Post Inpatient/ED Visit) (Signed)
   09/09/2022  Name: SHAKKIA KAFFENBERGER MRN: [id number] DOB: 1975/07/24  Today's TOC FU Call Status: Today's TOC FU Call Status:: Unsuccessful Call (3rd Attempt) Unsuccessful Call (1st Attempt) Date: 09/07/22 Unsuccessful Call (2nd Attempt) Date: 09/08/22 Unsuccessful Call (3rd Attempt) Date: 09/09/22  Attempted to reach the patient regarding the most recent Inpatient/ED visit.  Follow Up Plan: No further outreach attempts will be made at this time. We have been unable to contact the patient.  Signature Karena Addison, LPN [facility] Nurse Health Advisor Direct Dial [phone]

## 2022-10-12 ENCOUNTER — Telehealth: Payer: Self-pay

## 2022-10-12 NOTE — Transitions of Care (Post Inpatient/ED Visit) (Unsigned)
   10/12/2022  Name: Crystal Mckinney MRN: [id number] DOB: 1975/10/06  Today's TOC FU Call Status: Today's TOC FU Call Status:: Unsuccessful Call (1st Attempt) Unsuccessful Call (1st Attempt) Date: 10/12/22  Attempted to reach the patient regarding the most recent Inpatient/ED visit.  Follow Up Plan: Additional outreach attempts will be made to reach the patient to complete the Transitions of Care (Post Inpatient/ED visit) call.   Signature Karena Addison, LPN [facility] Nurse Health Advisor Direct Dial [phone]

## 2022-10-13 NOTE — Transitions of Care (Post Inpatient/ED Visit) (Unsigned)
   10/13/2022  Name: Crystal Mckinney MRN: [id number] DOB: 1975-09-06  Today's TOC FU Call Status: Today's TOC FU Call Status:: Unsuccessful Call (2nd Attempt) Unsuccessful Call (1st Attempt) Date: 10/12/22 Unsuccessful Call (2nd Attempt) Date: 10/13/22  Attempted to reach the patient regarding the most recent Inpatient/ED visit.  Follow Up Plan: Additional outreach attempts will be made to reach the patient to complete the Transitions of Care (Post Inpatient/ED visit) call.   Signature Karena Addison, LPN [facility] Nurse Health Advisor Direct Dial [phone]

## 2022-10-14 NOTE — Transitions of Care (Post Inpatient/ED Visit) (Signed)
   10/14/2022  Name: Crystal Mckinney MRN: [id number] DOB: 1975/07/14  Today's TOC FU Call Status: Today's TOC FU Call Status:: Unsuccessful Call (3rd Attempt) Unsuccessful Call (1st Attempt) Date: 10/12/22 Unsuccessful Call (2nd Attempt) Date: 10/13/22 Unsuccessful Call (3rd Attempt) Date: 10/14/22  Attempted to reach the patient regarding the most recent Inpatient/ED visit.  Follow Up Plan: No further outreach attempts will be made at this time. We have been unable to contact the patient.  Signature Karena Addison, LPN [facility] Nurse Health Advisor Direct Dial [phone]

## 2022-11-20 ENCOUNTER — Other Ambulatory Visit: Payer: Self-pay | Admitting: Family Medicine

## 2022-11-20 DIAGNOSIS — K279 Peptic ulcer, site unspecified, unspecified as acute or chronic, without hemorrhage or perforation: Secondary | ICD-10-CM

## 2022-11-20 NOTE — Telephone Encounter (Signed)
Requested Prescriptions  Pending Prescriptions Disp Refills   pantoprazole (PROTONIX) 40 MG tablet [Pharmacy Med Name: PANTOPRAZOLE SOD DR 40 MG TAB] 180 tablet 0    Sig: TAKE 1 TABLET BY MOUTH TWICE A DAY     Gastroenterology: Proton Pump Inhibitors Passed - 11/20/2022  1:44 AM      Passed - Valid encounter within last 12 months    Recent Outpatient Visits           6 months ago Annual physical exam   [place] Primary Care [facility] Medicine at [facility], Ocie Bob, MD   10 months ago Exocrine pancreatic insufficiency   [facility] Primary Care & Sports Medicine at [facility], Ocie Bob, MD

## 2022-12-17 ENCOUNTER — Telehealth: Payer: Self-pay | Admitting: Family Medicine

## 2022-12-17 NOTE — Telephone Encounter (Signed)
Medication Refill - Medication: octreotide acetate injection 1mg  [facility] to have it delivered faster to her instead of having the info faxed over, then [facility] Requesting 12 refills  Has the patient contacted their pharmacy? yes (Agent: If yes, when and what did the pharmacy advise?)contact pcp  Preferred Pharmacy (with phone number or street name[id number] [place], [place] - [address] Phone: [phone]  Fax: [phone]   Has the patient been seen for an appointment in the last year OR does the patient have an upcoming appointment? yes  Agent: Please be advised that RX refills may take up to 3 business days. We ask that you follow-up with your pharmacy.

## 2022-12-18 NOTE — Telephone Encounter (Signed)
Spoke with patient. She said she has been trying to contact her [facility] GI doctor and has not been getting any response back from them for refilling this medication. Recommended her calling again and trying to "make an appointment" and speaking to a receptionist about getting her to a nurse to refill this medication.  She said she will try this. She understood why Dr Ashley Royalty could not prescribe and will continue to reach out to GI.  - Andrienne Havener

## 2023-01-07 ENCOUNTER — Other Ambulatory Visit: Payer: Self-pay | Admitting: Family Medicine

## 2023-01-07 DIAGNOSIS — K8681 Exocrine pancreatic insufficiency: Secondary | ICD-10-CM

## 2023-01-07 NOTE — Telephone Encounter (Signed)
Requested medications are due for refill today.  yes  Requested medications are on the active medications list.  yes  Last refill. Vit D 2/227/2024 #8 0 rf, Creon 01/20/2022 #180 2 rf  Future visit scheduled.   no  Notes to clinic.  Provider to review for refill.    Requested Prescriptions  Pending Prescriptions Disp Refills   Vitamin D, Ergocalciferol, (DRISDOL) 1.25 MG (50000 UNIT) CAPS capsule [Pharmacy Med Name: VITAMIN D2 1.25MG (50,000 UNIT)] 8 capsule 0    Sig: Take 1 capsule (50,000 Units total) by mouth every 7 (seven) days. Take for 8 total doses(weeks)     Endocrinology:  Vitamins - Vitamin D Supplementation 2 Failed - 01/07/2023  9:14 AM      Failed - Manual Review: Route requests for 50,000 IU strength to the provider      Failed - Ca in normal range and within 360 days    Calcium  Date Value Ref Range Status  08/26/2022 8.7 (L) 8.9 - 10.3 mg/dL Final         Failed - Vitamin D in normal range and within 360 days    Vit D, 25-Hydroxy  Date Value Ref Range Status  05/01/2022 9.7 (L) 30.0 - 100.0 ng/mL Final    Comment:    Vitamin D deficiency has been defined by the Institute of Medicine and an Endocrine Society practice guideline as a level of serum 25-OH vitamin D less than 20 ng/mL (1,2). The Endocrine Society went on to further define vitamin D insufficiency as a level between 21 and 29 ng/mL (2). 1. IOM (Institute of Medicine). 2010. Dietary reference    intakes for calcium and D. [place]: The    [employer]. 2. Holick MF, Binkley [place], Bischoff-Ferrari HA, et al.    Evaluation, treatment, and prevention of vitamin D    deficiency: an Endocrine Society clinical practice    guideline. JCEM. 2011 Jul; 96(7):1911-30.          Passed - Valid encounter within last 12 months    Recent Outpatient Visits           8 months ago Annual physical exam   [place] Primary Care & [facility] Medicine at [facility], Ocie Bob, MD   11  months ago Exocrine pancreatic insufficiency    Primary Care & Sports Medicine at [facility], Ocie Bob, MD               CREON [id number] units CPEP capsule [Pharmacy Med Name: CREON DR 36,000 UNIT CAPSULE] 200 capsule 2    Sig: 2 CAPSULES BY MOUTH THREE TIMES A DAY WITH MEALS, 1 CAPSULE BY MOUTH AS NEEDED WITH SNACKS     Off-Protocol Failed - 01/07/2023  9:14 AM      Failed - Medication not assigned to a protocol, review manually.      Passed - Valid encounter within last 12 months    Recent Outpatient Visits           8 months ago Annual physical exam   [facility] at [facility], Ocie Bob, MD   11 months ago Exocrine pancreatic insufficiency   [facility] Primary Care & Sports Medicine at [facility], Ocie Bob, MD

## 2023-02-16 ENCOUNTER — Ambulatory Visit
Admission: RE | Admit: 2023-02-16 | Discharge: 2023-02-16 | Disposition: A | Discharge status: Home / Self Care | Payer: Medicaid Other | Source: Ambulatory Visit

## 2023-02-16 ENCOUNTER — Ambulatory Visit: Payer: Medicaid Other

## 2023-02-16 VITALS — BP 114/77 | HR 97 | Temp 98.5°F | Resp 17

## 2023-02-16 DIAGNOSIS — S7011XA Contusion of right thigh, initial encounter: Secondary | ICD-10-CM

## 2023-02-16 DIAGNOSIS — S7001XA Contusion of right hip, initial encounter: Secondary | ICD-10-CM | POA: Diagnosis not present

## 2023-02-16 HISTORY — DX: Malignant (primary) neoplasm, unspecified: C80.1

## 2023-02-16 MED ORDER — OXYCODONE-ACETAMINOPHEN 5-325 MG PO TABS: 1.0000 | ORAL_TABLET | Freq: Four times a day (QID) | ORAL | 0 refills | Status: DC | PRN

## 2023-02-16 NOTE — ED Triage Notes (Signed)
Patient states that she fell 3 days ago. She fell coming out of the shower. Right side.

## 2023-02-16 NOTE — ED Provider Notes (Signed)
[facility]    CSN: [id number] Arrival date & time: 02/16/23  0847      History   Chief Complaint Chief Complaint  Patient presents with   Fall    HPI Crystal Mckinney is a 47 y.o. female.   HPI  47 year old female with a past medical history significant for seizures, IBS, GERD, celiac disease, malnutrition, anxiety, and ADD presents for evaluation of 3 days worth of right hip pain.  She reports that she slipped coming out of the shower and landed on her right hip on a tile floor.  She is able to bear weight though she has to walk on her toes with her right leg rotated inward.  She reports that she broke her left hip with a similar fall on ice a number of years ago.  She contacted her brother, whom is an Investment banker, operational, who encouraged her to come get an x-ray.  She has marked swelling and bruising to the right hip and anterior thigh.  Past Medical History:  Diagnosis Date   ADD (attention deficit disorder)    Anxiety    Cancer (HCC)    Celiac disease    GERD (gastroesophageal reflux disease)    IBS (irritable bowel syndrome)    Malnutrition (HCC)    Seizures (HCC)     Patient Active Problem List   Diagnosis Date Noted   Celiac disease 05/02/2022   GERD (gastroesophageal reflux disease) 05/02/2022   IBS (irritable bowel syndrome) 05/02/2022   Anxiety and depression 05/01/2022   Annual physical exam 05/01/2022   Actinic keratoses 05/01/2022   Exocrine pancreatic insufficiency 01/20/2022   PUD (peptic ulcer disease) 01/20/2022   Seizures (HCC) 01/20/2022    Past Surgical History:  Procedure Laterality Date   ABDOMINAL SURGERY     HEMATOMA EVACUATION     HIP SURGERY Left     OB History   No obstetric history on file.      Home Medications    Prior to Admission medications   Medication Sig Start Date End Date Taking? Authorizing Provider  bisacodyl (DULCOLAX) 10 MG suppository Place rectally. 07/03/22  Yes [provider]   carbamazepine (TEGRETOL XR) 200 MG 12 hr tablet Take 200 mg by mouth 2 (two) times daily. 2 tablets in morning and 2 tablets at night.   Yes [provider]  cloBAZam (ONFI) 10 MG tablet Take 10 mg by mouth at bedtime.   Yes [provider]  CREON 36000-114000 units CPEP capsule 2 CAPSULES BY MOUTH THREE TIMES A DAY WITH MEALS, 1 CAPSULE BY MOUTH AS NEEDED WITH SNACKS 01/07/23  Yes Jerrol Banana, MD  cyanocobalamin (VITAMIN B12) 1000 MCG/ML injection Inject 1,000 mcg into the muscle every 30 (thirty) days.   Yes [provider]  dicyclomine (BENTYL) 10 MG capsule Take 10 mg by mouth 3 (three) times daily. 07/03/22  Yes [provider]  folic acid (FOLVITE) 1 MG tablet Take 1 tablet by mouth daily. 01/17/23 01/17/24 Yes [provider]  hydrOXYzine (ATARAX) 25 MG tablet Take by mouth. 08/28/22  Yes [provider]  levonorgestrel (MIRENA) 20 MCG/24HR IUD 1 each by Intrauterine route once.   Yes [provider]  MENATETRENONE PO Take 1 tablet by mouth every morning. 01/17/23  Yes [provider]  Multiple Vitamins-Minerals (MULTIVITAMIN WITH MINERALS) tablet Take 1 tablet by mouth daily.   Yes [provider]  oxyCODONE-acetaminophen (PERCOCET/ROXICET) 5-325 MG tablet Take 1 tablet by mouth every 6 (six) hours  as needed for severe pain (pain score 7-10). 02/16/23  Yes Becky Augusta, NP  pantoprazole (PROTONIX) 40 MG tablet TAKE 1 TABLET BY MOUTH TWICE A DAY 11/20/22  Yes Jerrol Banana, MD  Venlafaxine HCl (EFFEXOR XR PO) Take 75 mg by mouth daily.   Yes [provider]  Vitamin D, Ergocalciferol, (DRISDOL) 1.25 MG (50000 UNIT) CAPS capsule Take 1 capsule (50,000 Units total) by mouth every 7 (seven) days. Take for 8 total doses(weeks) 05/05/22  Yes Jerrol Banana, MD  escitalopram (LEXAPRO) 20 MG tablet Take 1 tablet by mouth daily. 02/28/18 08/26/19  [provider]  lamoTRIgine (LAMICTAL) 100 MG  tablet Take 200 mg by mouth 2 (two) times daily.  01/15/18 08/26/19  [provider]  levETIRAcetam (KEPPRA) 500 MG tablet Take 1,500 mg by mouth 2 (two) times daily.  01/15/18 08/26/19  [provider]  omeprazole (PRILOSEC) 20 MG capsule Take 1 capsule (20 mg total) by mouth daily. 04/24/18 08/26/19  Katha Hamming, MD    Family History Family History  Problem Relation Age of Onset   Healthy Mother    Hyperlipidemia Father     Social History Social History   Tobacco Use   Smoking status: Never   Smokeless tobacco: Never  Vaping Use   Vaping status: Never Used  Substance Use Topics   Alcohol use: Not Currently    Comment: social   Drug use: Never     Allergies   Dilantin [phenytoin], Penicillins, and Gluten meal   Review of Systems Review of Systems  Musculoskeletal:  Positive for arthralgias and joint swelling.  Skin:  Positive for color change.     Physical Exam Triage Vital Signs ED Triage Vitals  Encounter Vitals Group     BP      Systolic BP Percentile      Diastolic BP Percentile      Pulse      Resp      Temp      Temp src      SpO2      Weight      Height      Head Circumference      Peak Flow      Pain Score      Pain Loc      Pain Education      Exclude from Growth Chart    No data found.  Updated Vital Signs BP 114/77 (BP Location: Left Arm)   Pulse 97   Temp 98.5 F (36.9 C) (Oral)   Resp 17   SpO2 98%   Visual Acuity Right Eye Distance:   Left Eye Distance:   Bilateral Distance:    Right Eye Near:   Left Eye Near:    Bilateral Near:     Physical Exam Vitals and nursing note reviewed.  Constitutional:      Appearance: Normal appearance. She is not ill-appearing.  HENT:     Head: Normocephalic and atraumatic.  Musculoskeletal:        General: Swelling, tenderness and signs of injury present. No deformity.  Skin:    General: Skin is warm and dry.     Capillary Refill: Capillary refill takes less than  2 seconds.     Findings: Bruising present.  Neurological:     General: No focal deficit present.     Mental Status: She is alert and oriented to person, place, and time.      UC Treatments / Results  Labs (all labs  ordered are listed, but only abnormal results are displayed) Labs Reviewed - No data to display  EKG   Radiology DG Hip Unilat W or Wo Pelvis 2-3 Views Right  Result Date: 02/16/2023 CLINICAL DATA:  Right hip pain after fall 3 days ago. EXAM: DG HIP (WITH OR WITHOUT PELVIS) 2-3V RIGHT COMPARISON:  None Available. FINDINGS: There is no evidence of hip fracture or dislocation. There is no evidence of arthropathy or other focal bone abnormality. IMPRESSION: Negative. Electronically Signed   By: Lupita Raider M.D.   On: 02/16/2023 11:25    Procedures Procedures (including critical care time)  Medications Ordered in UC Medications - No data to display  Initial Impression / Assessment and Plan / UC Course  I have reviewed the triage vital signs and the nursing notes.  Pertinent labs & imaging results that were available during my care of the patient were reviewed by me and considered in my medical decision making (see chart for details).   Patient is a pleasant, nontoxic-appearing 47 year old female presenting for evaluation of right hip pain in the setting of a ground-level fall onto tile 3 days ago.  As you can see in image above, there is marked edema and ecchymosis to the greater trochanter as well as to the anterior thigh.  The area of ecchymosis is tender to palpation and the patient is markedly tender with palpation of the right greater trochanter.  The right greater trochanter is not on the same level as her left.  She does have mild shortening of the right leg but no internal rotation.  Additionally, the patient has a bruise to the lateral aspect of her right lower leg just below her knee.  DP and PT pulses are 2+.  Patient is also complaining of back pain and has  tenderness with palpation of her spine but no step-off.  There is also no ecchymosis to her back.  I suspect this is most likely  secondary musculoskeletal pain due to her fall.  I will obtain a x-ray of the right hip.  Right hip x-ray is independently reviewed and evaluated by me.  Impression: No evidence of fracture or dislocation.  Radiology overread is pending. Radiology impression concurs no evidence of fracture or dislocation.  Radiology overread is still pending.  I have contacted the reading room several times and finally was able to speak to a representative who will send to radiologist for dictation.  I will discharge patient home with a diagnosis of right hip contusion with a short prescription for Percocet that she can use as needed for severe pain.  She may use over-the-counter Tylenol and/or ibuprofen according to package instructions needed for mild to moderate pain.  If her pain does not improve she should follow-up with orthopedics.  Final Clinical Impressions(s) / UC Diagnoses   Final diagnoses:  Contusion of hip and thigh, right, initial encounter     Discharge Instructions      Your x-rays did not demonstrate any evidence of broken bones.  I do believe you have significantly bruised your hip.  You may continue to use over-the-counter Tylenol and/or ibuprofen according the package instructions as needed for mild to moderate pain.  I have sent over a prescription for Percocet and you can take 1 tablet every 6 hours as needed for severe pain.  Be mindful Percocet does contain Tylenol so do not take more than 4000 mg of Tylenol in total in a 24-hour period.  Also, be mindful this medication will  make you drowsy so do not drink alcohol or drive if you take it.  You may apply ice to your right hip for 20 minutes at a time, 2-3 times a day, as needed for pain and swelling.  If your pain does not improve, or worsens, I recommend that you follow-up with orthopedics.     ED  Prescriptions     Medication Sig Dispense Auth. Provider   oxyCODONE-acetaminophen (PERCOCET/ROXICET) 5-325 MG tablet Take 1 tablet by mouth every 6 (six) hours as needed for severe pain (pain score 7-10). 15 tablet Becky Augusta, NP      I have reviewed the PDMP during this encounter.   Becky Augusta, NP 02/16/23 [phone]

## 2023-02-16 NOTE — Discharge Instructions (Addendum)
Your x-rays did not demonstrate any evidence of broken bones.  I do believe you have significantly bruised your hip.  You may continue to use over-the-counter Tylenol and/or ibuprofen according the package instructions as needed for mild to moderate pain.  I have sent over a prescription for Percocet and you can take 1 tablet every 6 hours as needed for severe pain.  Be mindful Percocet does contain Tylenol so do not take more than 4000 mg of Tylenol in total in a 24-hour period.  Also, be mindful this medication will make you drowsy so do not drink alcohol or drive if you take it.  You may apply ice to your right hip for 20 minutes at a time, 2-3 times a day, as needed for pain and swelling.  If your pain does not improve, or worsens, I recommend that you follow-up with orthopedics.

## 2023-03-08 ENCOUNTER — Ambulatory Visit: Payer: Self-pay | Admitting: *Deleted

## 2023-03-08 ENCOUNTER — Ambulatory Visit: Payer: Self-pay

## 2023-03-08 NOTE — Telephone Encounter (Signed)
FYI- pt advised to go to ED.  KP

## 2023-03-08 NOTE — Telephone Encounter (Signed)
  Chief Complaint: fell right hip unable to walk Symptoms: fell couple of weeks ago right hip. Larey Seat again yesterday right hip severe pain , "looks like tumor" right hip. Unable to stand  Frequency: yesterday  Pertinent Negatives: Patient denies chest pain  Disposition: [x] ED /[] Urgent Care (no appt availability in office) / [] Appointment(In office/virtual)/ []  [facility]/ [] Home Care/ [] Refused Recommended Disposition /[] [facility]/ []  Follow-up with PCP Additional Notes:   Recommended to call 911 and go to ED now.      Reason for Disposition  [1] Can't stand (bear weight) or walk AND [2] new-onset after fall  Answer Assessment - Initial Assessment Questions 1. MECHANISM: "How did the fall happen?"     Larey Seat tripped on a sheet and fell on right hip yesterday  2. DOMESTIC VIOLENCE AND ELDER ABUSE SCREENING: "Did you fall because someone pushed you or tried to hurt you?" If Yes, ask: "Are you safe now?"     na 3. ONSET: "When did the fall happen?" (e.g., minutes, hours, or days ago)     Yesterday fell a couple of weeks ago too on right hip 4. LOCATION: "What part of the body hit the ground?" (e.g., back, buttocks, head, hips, knees, hands, head, stomach)     Right hip 5. INJURY: "Did you hurt (injure) yourself when you fell?" If Yes, ask: "What did you injure? Tell me more about this?" (e.g., body area; type of injury; pain severity)"     Yes  6. PAIN: "Is there any pain?" If Yes, ask: "How bad is the pain?" (e.g., Scale 1-10; or mild,  moderate, severe)   - NONE (0): No pain   - MILD (1-3): Doesn't interfere with normal activities    - MODERATE (4-7): Interferes with normal activities or awakens from sleep    - SEVERE (8-10): Excruciating pain, unable to do any normal activities      severe 7. SIZE: For cuts, bruises, or swelling, ask: "How large is it?" (e.g., inches or centimeters)      Looks like "tumor' right hip  8. PREGNANCY: "Is there any chance you  are pregnant?" "When was your last menstrual period?"     na 9. OTHER SYMPTOMS: "Do you have any other symptoms?" (e.g., dizziness, fever, weakness; new onset or worsening).      Pain, can stand but no weight on hip 10. CAUSE: "What do you think caused the fall (or falling)?" (e.g., tripped, dizzy spell)       Tripped over sheet in garage  Protocols used: [facility]

## 2023-04-05 ENCOUNTER — Ambulatory Visit: Payer: Self-pay

## 2023-04-06 ENCOUNTER — Ambulatory Visit (INDEPENDENT_AMBULATORY_CARE_PROVIDER_SITE_OTHER): Payer: Medicaid Other

## 2023-04-06 ENCOUNTER — Encounter: Payer: Self-pay | Admitting: Emergency Medicine

## 2023-04-06 ENCOUNTER — Ambulatory Visit
Admission: EM | Admit: 2023-04-06 | Discharge: 2023-04-06 | Disposition: A | Discharge status: Home / Self Care | Payer: Medicaid Other

## 2023-04-06 DIAGNOSIS — M79601 Pain in right arm: Secondary | ICD-10-CM | POA: Diagnosis not present

## 2023-04-06 DIAGNOSIS — S42001A Fracture of unspecified part of right clavicle, initial encounter for closed fracture: Secondary | ICD-10-CM

## 2023-04-06 DIAGNOSIS — T07XXXA Unspecified multiple injuries, initial encounter: Secondary | ICD-10-CM | POA: Diagnosis not present

## 2023-04-06 DIAGNOSIS — R079 Chest pain, unspecified: Secondary | ICD-10-CM | POA: Diagnosis not present

## 2023-04-06 MED ORDER — OXYCODONE HCL 5 MG PO TABS
5.0000 mg | ORAL_TABLET | Freq: Three times a day (TID) | ORAL | 0 refills | Status: AC | PRN
Start: 2023-04-06 — End: 2023-04-11

## 2023-04-06 NOTE — ED Triage Notes (Signed)
Patient presents with right shoulder pain. Patient states she fell on the ice 4 days ago.  Patient states she has been using ice.

## 2023-04-06 NOTE — ED Provider Notes (Signed)
[facility]    CSN: [id number] Arrival date & time: 04/06/23  1247      History   Chief Complaint Chief Complaint  Patient presents with   Shoulder Injury    HPI Crystal Mckinney is a 48 y.o. female presenting for 4 to 5-day history of right shoulder, right chest and right clavicular pain, swelling and bruising.  Patient reports slipping on ice and falling onto the right shoulder.  She believes something might be broken.  She has been taking Tylenol but says it has not helped her pain at all.  Reports that she cannot even lift her arm due to pain.  Reports that she is going through a lot right now.  Her father died yesterday and her mother is out of the country.  States she has not really been thinking about herself until recently and would like to get x-rays done.  She is denying any breathing difficulty or wheezing.  No report of hemoptysis, fatigue, palpitations or weakness.  No report of loss of consciousness or head injury.  HPI  Past Medical History:  Diagnosis Date   ADD (attention deficit disorder)    Anxiety    Cancer (HCC)    Celiac disease    GERD (gastroesophageal reflux disease)    IBS (irritable bowel syndrome)    Malnutrition (HCC)    Seizures (HCC)     Patient Active Problem List   Diagnosis Date Noted   Celiac disease 05/02/2022   GERD (gastroesophageal reflux disease) 05/02/2022   IBS (irritable bowel syndrome) 05/02/2022   Anxiety and depression 05/01/2022   Annual physical exam 05/01/2022   Actinic keratoses 05/01/2022   Exocrine pancreatic insufficiency 01/20/2022   PUD (peptic ulcer disease) 01/20/2022   Seizures (HCC) 01/20/2022    Past Surgical History:  Procedure Laterality Date   ABDOMINAL SURGERY     HEMATOMA EVACUATION     HIP SURGERY Left     OB History   No obstetric history on file.      Home Medications    Prior to Admission medications   Medication Sig Start Date End Date Taking? Authorizing Provider  octreotide  (SANDOSTATIN) 100 MCG/ML SOLN injection Inject into the skin. 03/02/23 05/01/23 Yes [provider]  oxyCODONE (ROXICODONE) 5 MG immediate release tablet Take 1 tablet (5 mg total) by mouth every 8 (eight) hours as needed for up to 5 days for severe pain (pain score 7-10). 04/06/23 04/11/23 Yes Eusebio Friendly B, PA-C  bisacodyl (DULCOLAX) 10 MG suppository Place rectally. 07/03/22   [provider]  carbamazepine (TEGRETOL XR) 200 MG 12 hr tablet Take 200 mg by mouth 2 (two) times daily. 2 tablets in morning and 2 tablets at night.    [provider]  cloBAZam (ONFI) 10 MG tablet Take 10 mg by mouth at bedtime.    [provider]  CREON 36000-114000 units CPEP capsule 2 CAPSULES BY MOUTH THREE TIMES A DAY WITH MEALS, 1 CAPSULE BY MOUTH AS NEEDED WITH SNACKS 01/07/23   Jerrol Banana, MD  cyanocobalamin (VITAMIN B12) 1000 MCG/ML injection Inject 1,000 mcg into the muscle every 30 (thirty) days.    [provider]  dicyclomine (BENTYL) 10 MG capsule Take 10 mg by mouth 3 (three) times daily. 07/03/22   [provider]  folic acid (FOLVITE) 1 MG tablet Take 1 tablet by mouth daily. 01/17/23 01/17/24  [provider]  hydrOXYzine (ATARAX) 25 MG tablet Take by mouth. 08/28/22   [provider]  levonorgestrel (MIRENA) 20 MCG/24HR IUD 1 each by Intrauterine route once.    [provider]  MENATETRENONE PO Take 1 tablet by mouth every morning. 01/17/23   [provider]  Multiple Vitamins-Minerals (MULTIVITAMIN WITH MINERALS) tablet Take 1 tablet by mouth daily.    [provider]  pantoprazole (PROTONIX) 40 MG tablet TAKE 1 TABLET BY MOUTH TWICE A DAY 11/20/22   Jerrol Banana, MD  Venlafaxine HCl (EFFEXOR XR PO) Take 75 mg by mouth daily.    [provider]  Vitamin D, Ergocalciferol, (DRISDOL) 1.25 MG (50000 UNIT) CAPS capsule Take 1 capsule (50,000 Units total) by mouth every 7 (seven) days. Take for  8 total doses(weeks) 05/05/22   Jerrol Banana, MD  escitalopram (LEXAPRO) 20 MG tablet Take 1 tablet by mouth daily. 02/28/18 08/26/19  [provider]  lamoTRIgine (LAMICTAL) 100 MG tablet Take 200 mg by mouth 2 (two) times daily.  01/15/18 08/26/19  [provider]  levETIRAcetam (KEPPRA) 500 MG tablet Take 1,500 mg by mouth 2 (two) times daily.  01/15/18 08/26/19  [provider]  omeprazole (PRILOSEC) 20 MG capsule Take 1 capsule (20 mg total) by mouth daily. 04/24/18 08/26/19  Katha Hamming, MD    Family History Family History  Problem Relation Age of Onset   Healthy Mother    Hyperlipidemia Father     Social History Social History   Tobacco Use   Smoking status: Never   Smokeless tobacco: Never  Vaping Use   Vaping status: Never Used  Substance Use Topics   Alcohol use: Not Currently    Comment: social   Drug use: Never     Allergies   Dilantin [phenytoin], Penicillins, and Gluten meal   Review of Systems Review of Systems  Constitutional:  Negative for fatigue.  Respiratory:  Negative for cough and shortness of breath.   Cardiovascular:  Positive for chest pain.  Musculoskeletal:  Positive for arthralgias. Negative for back pain, gait problem, joint swelling, neck pain and neck stiffness.  Skin:  Positive for color change. Negative for wound.  Neurological:  Negative for syncope, weakness and numbness.     Physical Exam Triage Vital Signs ED Triage Vitals  Encounter Vitals Group     BP 04/06/23 1416 134/80     Systolic BP Percentile --      Diastolic BP Percentile --      Pulse Rate 04/06/23 1416 87     Resp 04/06/23 1416 18     Temp 04/06/23 1416 97.6 F (36.4 C)     Temp src --      SpO2 04/06/23 1416 99 %     Weight --      Height --      Head Circumference --      Peak Flow --      Pain Score 04/06/23 1413 10     Pain Loc --      Pain Education --      Exclude from Growth Chart --    No data found.  Updated  Vital Signs BP 134/80 (BP Location: Left Arm)   Pulse 87   Temp 97.6 F (36.4 C)   Resp 18   SpO2 99%       Physical Exam Vitals and nursing note reviewed.  Constitutional:      General: She is not in acute distress.    Appearance: Normal appearance. She is not ill-appearing or toxic-appearing.  HENT:  Head: Normocephalic and atraumatic.  Eyes:     General: No scleral icterus.       Right eye: No discharge.        Left eye: No discharge.     Conjunctiva/sclera: Conjunctivae normal.  Cardiovascular:     Rate and Rhythm: Normal rate and regular rhythm.     Heart sounds: Normal heart sounds.  Pulmonary:     Effort: Pulmonary effort is normal. No respiratory distress.     Breath sounds: Normal breath sounds.  Chest:     Chest wall: Tenderness (diffusely right chest) present.  Musculoskeletal:     Right shoulder: Swelling (over clavicle) and bony tenderness (proximal humerus, entire clavicle) present. Decreased range of motion. Normal pulse.     Cervical back: Neck supple.     Comments: Significant contusions of the entire right shoulder, right chest and right posterior scapula.  Skin:    General: Skin is dry.  Neurological:     General: No focal deficit present.     Mental Status: She is alert. Mental status is at baseline.     Motor: No weakness.     Gait: Gait normal.  Psychiatric:        Mood and Affect: Mood normal.        Behavior: Behavior normal.      UC Treatments / Results  Labs (all labs ordered are listed, but only abnormal results are displayed) Labs Reviewed - No data to display  EKG   Radiology DG Shoulder Right Result Date: 04/06/2023 CLINICAL DATA:  Fall and trauma to the right upper extremity. EXAM: RIGHT SHOULDER - 2+ VIEW; RIGHT HUMERUS - 2+ VIEW COMPARISON:  None Available. FINDINGS: There is a mildly displaced fracture lateral right clavicle. No other acute fracture. The bones are osteopenic. No dislocation. The soft tissues are  unremarkable. IMPRESSION: Mildly displaced fracture of the lateral right clavicle. Electronically Signed   By: Elgie Collard M.D.   On: 04/06/2023 15:17   DG Humerus Right Result Date: 04/06/2023 CLINICAL DATA:  Fall and trauma to the right upper extremity. EXAM: RIGHT SHOULDER - 2+ VIEW; RIGHT HUMERUS - 2+ VIEW COMPARISON:  None Available. FINDINGS: There is a mildly displaced fracture lateral right clavicle. No other acute fracture. The bones are osteopenic. No dislocation. The soft tissues are unremarkable. IMPRESSION: Mildly displaced fracture of the lateral right clavicle. Electronically Signed   By: Elgie Collard M.D.   On: 04/06/2023 15:17   DG Chest 2 View Result Date: 04/06/2023 CLINICAL DATA:  Fall on shoulder, contusions, clavicle pain. Right-sided chest pain. Right shoulder pain. EXAM: CHEST - 2 VIEW COMPARISON:  11/06/2019, shoulder exam performed concurrently, reported separately. FINDINGS: Distal right clavicle fracture. Nondisplaced right lateral eighth rib fracture is remote and was present on prior exam. No evidence of acute rib fracture. No pneumothorax, pleural effusion, focal airspace disease or pulmonary edema. The heart is normal in size. Mediastinal contours are normal. IMPRESSION: 1. Distal right clavicle fracture. 2. No pelvic acute chest findings. Electronically Signed   By: Narda Rutherford M.D.   On: 04/06/2023 15:15    Procedures Procedures (including critical care time)  Medications Ordered in UC Medications - No data to display  Initial Impression / Assessment and Plan / UC Course  I have reviewed the triage vital signs and the nursing notes.  Pertinent labs & imaging results that were available during my care of the patient were reviewed by me and considered in my medical decision making (see chart  for details).   48 year old female presents for multiple injuries following accidental fall several days ago.  Reports slipping on ice and falling onto the right  shoulder.  Patient has diffuse contusions and swelling of the right shoulder, right upper back and right chest.  X-rays of right shoulder, right humerus, and chest obtained.  Patient has mildly displaced clavicular fracture.  Patient given sling.  Advised that she will need to follow-up with orthopedics as soon as possible.  Explained that she needs to perform supportive care and avoid using the affected extremity.  Advised continuing cryotherapy.  Patient given short supply of oxycodone as needed for severe pain.  Advised not to mix with alcohol or other substances.  Allowed patient to have pictures of all images.  She has a brother who is an Investment banker, operational and says that he is can help her follow-up with someone that he knows.  Information was given to her by orthopedists in the area as well.  Official radiology report only notes clavicular fracture.  Patient updated.  Multiple injuries with fracture requiring follow-up with orthopedics.  Final Clinical Impressions(s) / UC Diagnoses   Final diagnoses:  Multiple contusions  Right arm pain  Closed displaced fracture of right clavicle, unspecified part of clavicle, initial encounter  Right-sided chest pain     Discharge Instructions      -You have a clavicle fracture.  You should follow-up with orthopedics and one of the offices below or other preferred office. - I sent pain medicine. - Avoid using the arm until cleared by Ortho. - Continue applying ice.  You can also take Tylenol. - I will call you with your official x-ray results.  You have a condition requiring you to follow up with Orthopedics so please call one of the following office for appointment:   [facility] Address: [address] [place], [place] [address] Phone: [phone]  [facility] [address] [place], [place] [address] Phone: [phone]  [facility] [address], [place], [place] [address] Phone: [phone]    ED Prescriptions      Medication Sig Dispense Auth. Provider   oxyCODONE (ROXICODONE) 5 MG immediate release tablet Take 1 tablet (5 mg total) by mouth every 8 (eight) hours as needed for up to 5 days for severe pain (pain score 7-10). 15 tablet Shirlee Latch, PA-C      I have reviewed the PDMP during this encounter.   Shirlee Latch, PA-C 04/06/23 1625

## 2023-04-06 NOTE — Discharge Instructions (Addendum)
-  You have a clavicle fracture.  You should follow-up with orthopedics and one of the offices below or other preferred office. - I sent pain medicine. - Avoid using the arm until cleared by Ortho. - Continue applying ice.  You can also take Tylenol. - I will call you with your official x-ray results.  You have a condition requiring you to follow up with Orthopedics so please call one of the following office for appointment:   [facility] Address: [address] [place], [place] [address] Phone: [phone]  [facility] [address], [place], [place] [address] Phone: [phone]  [facility] [address], [place], [place] [address] Phone: [phone]

## 2023-04-29 ENCOUNTER — Other Ambulatory Visit: Payer: Self-pay | Admitting: Family Medicine

## 2023-04-29 ENCOUNTER — Encounter: Payer: Self-pay | Admitting: *Deleted

## 2023-04-29 DIAGNOSIS — K279 Peptic ulcer, site unspecified, unspecified as acute or chronic, without hemorrhage or perforation: Secondary | ICD-10-CM

## 2023-04-29 NOTE — Telephone Encounter (Signed)
 Requested medication (s) are due for refill today:   Yes  Requested medication (s) are on the active medication list:   Yes  Future visit scheduled:   No     On 08/31/2022 was a No Show   Last ordered: 11/20/2022 #180, 0 refills  Provider to review for refills since did not show up for last appt.   Requested Prescriptions  Pending Prescriptions Disp Refills   pantoprazole (PROTONIX) 40 MG tablet [Pharmacy Med Name: PANTOPRAZOLE SOD DR 40 MG TAB] 180 tablet 0    Sig: TAKE 1 TABLET BY MOUTH TWICE A DAY     Gastroenterology: Proton Pump Inhibitors Failed - 04/29/2023  1:16 PM      Failed - Valid encounter within last 12 months    Recent Outpatient Visits           12 months ago Annual physical exam   [facility], Ocie Bob, MD   1 year ago Exocrine pancreatic insufficiency   [facility] at [facility], Ocie Bob, MD

## 2023-06-28 ENCOUNTER — Other Ambulatory Visit: Payer: Self-pay | Admitting: Family Medicine

## 2023-06-28 DIAGNOSIS — K279 Peptic ulcer, site unspecified, unspecified as acute or chronic, without hemorrhage or perforation: Secondary | ICD-10-CM

## 2023-06-29 NOTE — Telephone Encounter (Signed)
 Unable to refill per protocol, appointment needed.   Requested Prescriptions  Pending Prescriptions Disp Refills   pantoprazole  (PROTONIX ) 40 MG tablet [Pharmacy Med Name: PANTOPRAZOLE  SOD DR 40 MG TAB] 180 tablet 0    Sig: TAKE 1 TABLET BY MOUTH TWICE A DAY     Gastroenterology: Proton Pump Inhibitors Failed - 06/29/2023  8:59 AM      Failed - Valid encounter within last 12 months    Recent Outpatient Visits   None

## 2023-06-29 NOTE — Telephone Encounter (Signed)
 Called patient to schedule CPE , no answer left VM to call back.

## 2023-07-22 ENCOUNTER — Telehealth: Payer: Self-pay

## 2023-07-22 NOTE — Transitions of Care (Post Inpatient/ED Visit) (Unsigned)
   07/22/2023  Name: LACONDA HERSKOVITZ MRN: [id number] DOB: 06-17-1975  Today's TOC FU Call Status: Today's TOC FU Call Status:: Unsuccessful Call (1st Attempt) Unsuccessful Call (1st Attempt) Date: 07/22/23  Attempted to reach the patient regarding the most recent Inpatient/ED visit.  Follow Up Plan: Additional outreach attempts will be made to reach the patient to complete the Transitions of Care (Post Inpatient/ED visit) call.   Signature Darrall Ellison, LPN [facility] Nurse Health Advisor Direct Dial [phone]

## 2023-07-23 NOTE — Transitions of Care (Post Inpatient/ED Visit) (Signed)
 07/23/2023  Name: Crystal Mckinney MRN: [id number] DOB: 1975/04/21  Today's TOC FU Call Status: Today's TOC FU Call Status:: Successful TOC FU Call Completed Unsuccessful Call (1st Attempt) Date: 07/22/23 [facility] FU Call Complete Date: 07/23/23 Patient's Name and Date of Birth confirmed.  Transition Care Management Follow-up Telephone Call Date of Discharge: 07/21/23 Discharge Facility: Other [facility]) Name of Other ([facility]) Discharge Facility: [facility] Type of Discharge: Inpatient Admission Primary Inpatient Discharge Diagnosis:: abd pain n/v How have you been since you were released from the hospital?: Better Any questions or concerns?: No  Items Reviewed: Did you receive and understand the discharge instructions provided?: Yes Medications obtained,verified, and reconciled?: Yes (Medications Reviewed) Any new allergies since your discharge?: No Dietary orders reviewed?: Yes Do you have support at home?: Yes People in Home [RPT]: spouse  Medications Reviewed Today: Medications Reviewed Today     Reviewed by Darrall Ellison, LPN (Licensed Practical Nurse) on 07/23/23 at 1031  Med List Status: <None>   Medication Order Taking? Sig Documenting Provider Last Dose Status Informant  bisacodyl  (DULCOLAX) 10 MG suppository [id number] No Place rectally. [provider] 02/16/2023 Active   carbamazepine  (TEGRETOL  XR) 200 MG 12 hr tablet [id number] No Take 200 mg by mouth 2 (two) times daily. 2 tablets in morning and 2 tablets at night. [provider] 02/16/2023 Active Self  cloBAZam (ONFI) 10 MG tablet [id number] No Take 10 mg by mouth at bedtime. [provider] 02/16/2023 Active   CREON  36000-114000 units CPEP capsule [id number] No 2 CAPSULES BY MOUTH THREE TIMES A DAY WITH MEALS, 1 CAPSULE BY MOUTH AS NEEDED WITH SNACKS Matthews, Jason J, MD 02/16/2023 Active   cyanocobalamin (VITAMIN B12) 1000 MCG/ML injection [id number] No Inject 1,000 mcg into the muscle  every 30 (thirty) days. [provider] 02/16/2023 Active   dicyclomine (BENTYL) 10 MG capsule [id number] No Take 10 mg by mouth 3 (three) times daily. [provider] 02/16/2023 Active   Discontinued 08/26/19 1601 folic acid (FOLVITE) 1 MG tablet [id number] No Take 1 tablet by mouth daily. [provider] 02/16/2023 Active   hydrOXYzine (ATARAX) 25 MG tablet [id number] No Take by mouth. [provider] 02/16/2023 Active   Discontinued 08/26/19 1601 Discontinued 08/26/19 1601 levonorgestrel (MIRENA) 20 MCG/24HR IUD [id number] No 1 each by Intrauterine route once. [provider] 02/16/2023 Active Self  MENATETRENONE PO [id number]  Take 1 tablet by mouth every morning. [provider]  Active   Multiple Vitamins-Minerals (MULTIVITAMIN WITH MINERALS) tablet [id number] No Take 1 tablet by mouth daily. [provider] 02/16/2023 Active Self    Discontinued 08/26/19 1601   pantoprazole  (PROTONIX ) 40 MG tablet [id number] No TAKE 1 TABLET BY MOUTH TWICE A DAY Ma Saupe, MD 02/16/2023 Active   Venlafaxine HCl (EFFEXOR XR PO) [id number] No Take 75 mg by mouth daily. [provider] 02/16/2023 Active Self  Vitamin D , Ergocalciferol , (DRISDOL ) 1.25 MG (50000 UNIT) CAPS capsule [id number] No Take 1 capsule (50,000 Units total) by mouth every 7 (seven) days. Take for 8 total doses(weeks) Ma Saupe, MD 02/16/2023 Active             Home Care and Equipment/Supplies: Were Home Health Services Ordered?: NA Any new equipment or medical supplies ordered?: NA  Functional Questionnaire: Do you need assistance with bathing/showering or dressing?: No Do you need assistance with meal preparation?: No Do you need assistance with eating?: No Do you have difficulty maintaining continence: No Do you need assistance with getting out of bed/getting  out of a chair/moving?: No Do you have difficulty managing or taking your medications?:  No  Follow up appointments reviewed: PCP Follow-up appointment confirmed?: NA Specialist Hospital Follow-up appointment confirmed?: No Reason Specialist Follow-Up Not Confirmed: Patient has Specialist Provider Number and will Call for Appointment Do you need transportation to your follow-up appointment?: No Do you understand care options if your condition(s) worsen?: Yes-patient verbalized understanding    SIGNATURE Darrall Ellison, LPN [facility] Nurse Health Advisor Direct Dial [phone]

## 2023-07-26 ENCOUNTER — Ambulatory Visit

## 2023-07-27 ENCOUNTER — Ambulatory Visit (INDEPENDENT_AMBULATORY_CARE_PROVIDER_SITE_OTHER)

## 2023-07-27 ENCOUNTER — Ambulatory Visit: Payer: Self-pay | Admitting: Emergency Medicine

## 2023-07-27 ENCOUNTER — Ambulatory Visit
Admission: RE | Admit: 2023-07-27 | Discharge: 2023-07-27 | Disposition: A | Discharge status: Home / Self Care | Source: Ambulatory Visit | Attending: Emergency Medicine | Admitting: Emergency Medicine

## 2023-07-27 VITALS — BP 126/87 | HR 86 | Temp 99.0°F | Resp 16 | Ht 65.0 in | Wt 113.0 lb

## 2023-07-27 DIAGNOSIS — S2242XA Multiple fractures of ribs, left side, initial encounter for closed fracture: Secondary | ICD-10-CM

## 2023-07-27 DIAGNOSIS — R0781 Pleurodynia: Secondary | ICD-10-CM

## 2023-07-27 MED ORDER — AZITHROMYCIN 250 MG PO TABS: 250.0000 mg | ORAL_TABLET | Freq: Every day | ORAL | 0 refills | Status: DC

## 2023-07-27 MED ORDER — TIZANIDINE HCL 4 MG PO TABS: 4.0000 mg | ORAL_TABLET | Freq: Three times a day (TID) | ORAL | 0 refills | Status: DC | PRN

## 2023-07-27 MED ORDER — OXYCODONE-ACETAMINOPHEN 5-325 MG PO TABS: 1.0000 | ORAL_TABLET | Freq: Four times a day (QID) | ORAL | 0 refills | Status: DC | PRN

## 2023-07-27 NOTE — ED Triage Notes (Signed)
 Pt c/o L sided rib pain d/t fall on sat. States she was moving things to her garage & fell.

## 2023-07-27 NOTE — Discharge Instructions (Addendum)
 Do not take more than 4000 mg of Tylenol  from all sources in 24 hours.  1-2 Percocet for severe pain, 1000 mg of Tylenol  for mild to moderate pain.  Zanaflex for muscle spasms.  Azithromycin as I am concerned that you could be developing a pneumonia.  start using your incentive spirometer at home. 1-2 times/hour while awake. Follow up with Dr. Augustus Ledger, go to the ER for the signs and symptoms we discussed

## 2023-07-27 NOTE — ED Provider Notes (Signed)
 HPI  SUBJECTIVE:  Crystal Mckinney is a 48 y.o. female who presents with left rib pain after lifting something too heavy days ago, subsequently falling onto her left rib cage.  She describes the pain as constant and sharp, with spasms.  She reports shortness of breath from the pain.  No bruising, coughing, wheezing, hemoptysis.  She denies seizures causing the fall.  She has tried ice, Tylenol  1000 mg every 4-6 hours without improvement in her symptoms.  Symptoms are worse with coughing, movement.  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  PCP: [facility]   Past Medical History:  Diagnosis Date   ADD (attention deficit disorder)    Anxiety    Cancer (HCC)    Celiac disease    GERD (gastroesophageal reflux disease)    IBS (irritable bowel syndrome)    Malnutrition (HCC)    Seizures (HCC)     Past Surgical History:  Procedure Laterality Date   ABDOMINAL SURGERY     HEMATOMA EVACUATION     HIP SURGERY Left     Family History  Problem Relation Age of Onset   Healthy Mother    Hyperlipidemia Father     Social History   Tobacco Use   Smoking status: Never   Smokeless tobacco: Never  Vaping Use   Vaping status: Never Used  Substance Use Topics   Alcohol use: Not Currently    Comment: social   Drug use: Never    No current facility-administered medications for this encounter.  Current Outpatient Medications:    azithromycin (ZITHROMAX) 250 MG tablet, Take 1 tablet (250 mg total) by mouth daily. 2 tabs po on day 1, 1 tab po on days 2-5, Disp: 6 tablet, Rfl: 0   bisacodyl  (DULCOLAX) 10 MG suppository, Place rectally., Disp: , Rfl:    carbamazepine  (TEGRETOL  XR) 200 MG 12 hr tablet, Take 200 mg by mouth 2 (two) times daily. 2 tablets in morning and 2 tablets at night., Disp: , Rfl:    cloBAZam (ONFI) 10 MG tablet, Take 10 mg by mouth at bedtime., Disp: , Rfl:    CREON  36000-114000 units CPEP capsule, 2  CAPSULES BY MOUTH THREE TIMES A DAY WITH MEALS, 1 CAPSULE BY MOUTH AS NEEDED WITH SNACKS, Disp: 200 capsule, Rfl: 2   cyanocobalamin (VITAMIN B12) 1000 MCG/ML injection, Inject 1,000 mcg into the muscle every 30 (thirty) days., Disp: , Rfl:    dicyclomine (BENTYL) 10 MG capsule, Take 10 mg by mouth 3 (three) times daily., Disp: , Rfl:    folic acid (FOLVITE) 1 MG tablet, Take 1 tablet by mouth daily., Disp: , Rfl:    hydrOXYzine (ATARAX) 25 MG tablet, Take by mouth., Disp: , Rfl:    levonorgestrel (MIRENA) 20 MCG/24HR IUD, 1 each by Intrauterine route once., Disp: , Rfl:    MENATETRENONE PO, Take 1 tablet by mouth every morning., Disp: , Rfl:    Multiple Vitamins-Minerals (MULTIVITAMIN WITH MINERALS) tablet, Take 1 tablet by mouth daily., Disp: , Rfl:    [Paused] naltrexone (DEPADE) 50 MG tablet, Take 1 tablet (50 mg total) by mouth daily. Start nightly at half dose (25 mg), x 3 days then increase to 50 mg nightly, Disp: , Rfl:    oxyCODONE -acetaminophen  (PERCOCET/ROXICET) 5-325 MG tablet, Take 1-2 tablets by mouth every 6 (six) hours as needed for severe pain (pain score 7-10)., Disp: 12 tablet, Rfl: 0   pantoprazole  (PROTONIX )  40 MG tablet, TAKE 1 TABLET BY MOUTH TWICE A DAY, Disp: 180 tablet, Rfl: 0   sucralfate (CARAFATE) 1 GM/10ML suspension, Take 10 mL (1 g total) by mouth four (4) times a day as needed., Disp: , Rfl:    tiZANidine (ZANAFLEX) 4 MG tablet, Take 1 tablet (4 mg total) by mouth every 8 (eight) hours as needed for muscle spasms., Disp: 30 tablet, Rfl: 0   Venlafaxine HCl (EFFEXOR XR PO), Take 75 mg by mouth daily., Disp: , Rfl:    Vitamin D , Ergocalciferol , (DRISDOL ) 1.25 MG (50000 UNIT) CAPS capsule, Take 1 capsule (50,000 Units total) by mouth every 7 (seven) days. Take for 8 total doses(weeks), Disp: 8 capsule, Rfl: 0    ROS  As noted in HPI.   Physical Exam  BP 126/87 (BP Location: Right Arm)   Pulse 86   Temp 99 F (37.2 C) (Oral)   Resp 16   Ht 5\' 5"  (1.651 m)    Wt 51.3 kg   SpO2 96%   BMI 18.80 kg/m   Constitutional: Well developed, well nourished, in moderate amount of pain Eyes: PERRL, EOMI, conjunctiva normal bilaterally HENT: Normocephalic, atraumatic,mucus membranes moist Respiratory: Bruising lower lateral chest wall.  Exquisite point tenderness over ribs 5, 7 and 8.  Crackles left lower lobe.  No other rib tenderness. Cardiovascular: Normal rate and rhythm, no murmurs, no gallops, no rubs GI: nondistended skin: No rash, skin intact Musculoskeletal: no deformities Neurologic: Alert & oriented x 3, CN III-XII grossly intact, no motor deficits, sensation grossly intact Psychiatric: Speech and behavior appropriate   ED Course   Medications - No data to display  Orders Placed This Encounter  Procedures   DG Ribs Unilateral W/Chest Left    Standing Status:   Standing    Number of Occurrences:   1    Reason for Exam (SYMPTOM  OR DIAGNOSIS REQUIRED):   fell on left side 4 days ago. left rib pain    Is patient pregnant?:   No   No results found for this or any previous visit (from the past 24 hours). DG Ribs Unilateral W/Chest Left Result Date: 07/27/2023 CLINICAL DATA:  Fall and left chest wall pain. EXAM: LEFT RIBS AND CHEST - 3+ VIEW COMPARISON:  Chest radiograph dated 04/06/2023. FINDINGS: No focal consolidation, pleural effusion, pneumothorax. The cardiac silhouette is within limits. Acute fracture of the posterior left fifth rib. Age indeterminate fractures of the posterior left seventh and eighth ribs. IMPRESSION: 1. No active cardiopulmonary disease. 2. Left rib fractures.  No pneumothorax. Electronically Signed   By: Angus Bark M.D.   On: 07/27/2023 14:20    ED Clinical Impression  1. Closed fracture of multiple ribs of left side, initial encounter   2. Rib pain on left side      ED Assessment/Plan      [place] [facility] database reviewed for this patient, and feel that the risk/benefit ratio today is favorable for  proceeding with a prescription for controlled substance. Pt last rx'd 10 oxycontin  07/21/2023  Reviewed imaging independently.  Fractures  5, 7 8 that I can see.  No pneumothorax, consolidation.  No appreciable pleural effusion.  Will contact patient [phone] if radiology overread differs enough from mine and we need to change management.  Reviewed radiology report.  Rib fractures, no pneumothorax, consolidation or pleural effusion consistent with my read. See radiology report for full details.  Discussed with patient to not take more than 4000 mg of Tylenol  from all  sources in 24 hours.  Home with Percocet for severe pain, 1000 mg of Tylenol  for mild to moderate pain.  Zanaflex.  Azithromycin as I am concerned that she could be developing a pneumonia with crackles in her left lower lobe.  She has an incentive spirometer at home.  Advised her to start using it.  She declined lidocaine patch as she states this does not work.  Follow-up with PCP as needed.  ER return precautions given.  Discussed  imaging, MDM, treatment plan, and plan for follow-up with patient Discussed sn/sx that should prompt return to the ED. patient agrees with plan.   Meds ordered this encounter  Medications   tiZANidine (ZANAFLEX) 4 MG tablet    Sig: Take 1 tablet (4 mg total) by mouth every 8 (eight) hours as needed for muscle spasms.    Dispense:  30 tablet    Refill:  0   oxyCODONE -acetaminophen  (PERCOCET/ROXICET) 5-325 MG tablet    Sig: Take 1-2 tablets by mouth every 6 (six) hours as needed for severe pain (pain score 7-10).    Dispense:  12 tablet    Refill:  0   azithromycin (ZITHROMAX) 250 MG tablet    Sig: Take 1 tablet (250 mg total) by mouth daily. 2 tabs po on day 1, 1 tab po on days 2-5    Dispense:  6 tablet    Refill:  0      *This clinic note was created using Scientist, clinical (histocompatibility and immunogenetics). Therefore, there may be occasional mistakes despite careful proofreading. ?    Ethlyn Herd,  MD 07/28/23 1731

## 2023-07-29 ENCOUNTER — Inpatient Hospital Stay: Admitting: Family Medicine

## 2023-08-21 ENCOUNTER — Ambulatory Visit (INDEPENDENT_AMBULATORY_CARE_PROVIDER_SITE_OTHER)

## 2023-08-21 ENCOUNTER — Ambulatory Visit
Admission: RE | Admit: 2023-08-21 | Discharge: 2023-08-21 | Disposition: A | Discharge status: Home / Self Care | Payer: Self-pay | Source: Ambulatory Visit | Attending: Family Medicine | Admitting: Family Medicine

## 2023-08-21 VITALS — BP 160/106 | HR 100 | Temp 98.9°F | Resp 14 | Ht 65.0 in | Wt 107.0 lb

## 2023-08-21 DIAGNOSIS — R509 Fever, unspecified: Secondary | ICD-10-CM

## 2023-08-21 DIAGNOSIS — R5381 Other malaise: Secondary | ICD-10-CM

## 2023-08-21 DIAGNOSIS — E871 Hypo-osmolality and hyponatremia: Secondary | ICD-10-CM | POA: Diagnosis not present

## 2023-08-21 LAB — COMPREHENSIVE METABOLIC PANEL WITH GFR
ALT: 19 U/L (ref 0–44)
AST: 43 U/L — ABNORMAL HIGH (ref 15–41)
Albumin: 5 g/dL (ref 3.5–5.0)
Alkaline Phosphatase: 168 U/L — ABNORMAL HIGH (ref 38–126)
Anion gap: 18 — ABNORMAL HIGH (ref 5–15)
BUN: 18 mg/dL (ref 6–20)
CO2: 18 mmol/L — ABNORMAL LOW (ref 22–32)
Calcium: 9.6 mg/dL (ref 8.9–10.3)
Chloride: 87 mmol/L — ABNORMAL LOW (ref 98–111)
Creatinine, Ser: 0.7 mg/dL (ref 0.44–1.00)
GFR, Estimated: >60 mL/min (ref >60)
Glucose, Bld: 122 mg/dL — ABNORMAL HIGH (ref 70–99)
Potassium: 4 mmol/L (ref 3.5–5.1)
Sodium: 123 mmol/L — ABNORMAL LOW (ref 135–145)
Total Bilirubin: 0.3 mg/dL (ref 0.0–1.2)
Total Protein: 9.1 g/dL — ABNORMAL HIGH (ref 6.5–8.1)

## 2023-08-21 LAB — URINALYSIS, W/ REFLEX TO CULTURE (INFECTION SUSPECTED)
Bilirubin Urine: NEGATIVE
Glucose, UA: 500 mg/dL — AB
Hgb urine dipstick: NEGATIVE
Ketones, ur: NEGATIVE mg/dL
Leukocytes,Ua: NEGATIVE
Nitrite: NEGATIVE
Protein, ur: 30 mg/dL — AB
Specific Gravity, Urine: 1.01 (ref 1.005–1.030)
WBC, UA: NONE SEEN WBC/hpf (ref 0–5)
pH: 5.5 (ref 5.0–8.0)

## 2023-08-21 LAB — CBC WITH DIFFERENTIAL/PLATELET
Abs Immature Granulocytes: 0.01 10*3/uL (ref 0.00–0.07)
Basophils Absolute: 0 10*3/uL (ref 0.0–0.1)
Basophils Relative: 1 %
Eosinophils Absolute: 0 10*3/uL (ref 0.0–0.5)
Eosinophils Relative: 0 %
HCT: 26.6 % — ABNORMAL LOW (ref 36.0–46.0)
Hemoglobin: 9.8 g/dL — ABNORMAL LOW (ref 12.0–15.0)
Immature Granulocytes: 0 %
Lymphocytes Relative: 16 %
Lymphs Abs: 0.7 10*3/uL (ref 0.7–4.0)
MCH: 35.9 pg — ABNORMAL HIGH (ref 26.0–34.0)
MCHC: 36.8 g/dL — ABNORMAL HIGH (ref 30.0–36.0)
MCV: 97.4 fL (ref 80.0–100.0)
Monocytes Absolute: 0.6 10*3/uL (ref 0.1–1.0)
Monocytes Relative: 14 %
Neutro Abs: 2.9 10*3/uL (ref 1.7–7.7)
Neutrophils Relative %: 69 %
Platelets: 273 10*3/uL (ref 150–400)
RBC: 2.73 MIL/uL — ABNORMAL LOW (ref 3.87–5.11)
RDW: 12.2 % (ref 11.5–15.5)
WBC: 4.2 10*3/uL (ref 4.0–10.5)
nRBC: 0 % (ref 0.0–0.2)

## 2023-08-21 NOTE — ED Notes (Signed)
 Patient is being discharged from the Urgent Care and sent to the Emergency Department via POV . Per Ramonita Burow, NP, patient is in need of higher level of care due to Hyponatremia, fever. Patient is aware and verbalizes understanding of plan of care.  Vitals:   08/21/23 1245 08/21/23 1246  BP: (!) 150/103 (!) 160/106  Pulse:  100  Resp:  14  Temp:  98.9 F (37.2 C)  SpO2:  98%

## 2023-08-21 NOTE — Discharge Instructions (Signed)
 Please go to the ER for further evaluation of your symptoms

## 2023-08-21 NOTE — ED Provider Notes (Signed)
 [facility]    CSN: [id number] Arrival date & time: 08/21/23  1236      History   Chief Complaint Chief Complaint  Patient presents with   Fever    Appointment   Rash    HPI Crystal Mckinney is a 48 y.o. female presents for fevers, malaise, rash.  Patient has a past medical history of GERD, seizures, chronic EtOH abuse, anxiety, celiac's disease, AVM.  Patient reports 2 weeks of daily low-grade fevers around 99-ish.  States 1 week ago today her fever did spike to 101 and she also developed a nonpruritic red rash on her entire body.  States the rash lasted about 3 days then resolved as well as that high-grade fever.  Does report a residual rash on her right cheek that is nonpruritic.  Continues to report low-grade fevers as well.  She denies any cough, congestion, sore throat, ear pain, shortness of breath, hemoptysis, dysuria, chest pain.  Does endorse some urinary frequency but no hematuria.  Denies any neck or back pain.  No abdominal pain.  States she has some dizziness without syncope and occasional palpitations.  Has a history of celiac's and reports chronic diarrhea but no new symptoms outside of her typical presentation.  Also reports history of AVM.  Denies any insect bites or tick bites.  No known sick contacts.  She was seen in urgent care on May 20 for fall resulting in fractures of ribs 5 7 and 8 on left side.  No pneumothorax but possible consolidation or pleural effusion noted.  She was started on Percocet, Zanaflex , azithromycin  due to concern for possible left lower lobe pneumonia.  Patient states she completed all treatment and the majority of her pain has resolved with occasional lower left rib pain with movement.  She states her fever resolves with over-the-counter Tylenol .  No chest pain or shortness of breath.  No other concerns at this time.   Fever Associated symptoms: rash   Rash Associated symptoms: fever     Past Medical History:  Diagnosis Date   ADD  (attention deficit disorder)    Anxiety    Cancer (HCC)    Celiac disease    GERD (gastroesophageal reflux disease)    IBS (irritable bowel syndrome)    Malnutrition (HCC)    Seizures (HCC)     Patient Active Problem List   Diagnosis Date Noted   Celiac disease 05/02/2022   GERD (gastroesophageal reflux disease) 05/02/2022   IBS (irritable bowel syndrome) 05/02/2022   Anxiety and depression 05/01/2022   Annual physical exam 05/01/2022   Actinic keratoses 05/01/2022   Exocrine pancreatic insufficiency 01/20/2022   PUD (peptic ulcer disease) 01/20/2022   Seizures (HCC) 01/20/2022    Past Surgical History:  Procedure Laterality Date   ABDOMINAL SURGERY     HEMATOMA EVACUATION     HIP SURGERY Left     OB History   No obstetric history on file.      Home Medications    Prior to Admission medications   Medication Sig Start Date End Date Taking? Authorizing Provider  azithromycin  (ZITHROMAX ) 250 MG tablet Take 1 tablet (250 mg total) by mouth daily. 2 tabs po on day 1, 1 tab po on days 2-5 07/27/23   Ethlyn Herd, MD  bisacodyl  (DULCOLAX) 10 MG suppository Place rectally. 07/03/22   [provider]  carbamazepine  (TEGRETOL  XR) 200 MG 12 hr tablet Take 200 mg by mouth 2 (two) times daily. 2 tablets in morning and  2 tablets at night.    [provider]  cloBAZam (ONFI) 10 MG tablet Take 10 mg by mouth at bedtime.    [provider]  CREON  36000-114000 units CPEP capsule 2 CAPSULES BY MOUTH THREE TIMES A DAY WITH MEALS, 1 CAPSULE BY MOUTH AS NEEDED WITH SNACKS 01/07/23   Matthews, Jason J, MD  cyanocobalamin (VITAMIN B12) 1000 MCG/ML injection Inject 1,000 mcg into the muscle every 30 (thirty) days.    [provider]  dicyclomine (BENTYL) 10 MG capsule Take 10 mg by mouth 3 (three) times daily. 07/03/22   [provider]  folic acid (FOLVITE) 1 MG tablet Take 1 tablet by mouth daily. 01/17/23 01/17/24  [provider]   hydrOXYzine (ATARAX) 25 MG tablet Take by mouth. 08/28/22   [provider]  levonorgestrel (MIRENA) 20 MCG/24HR IUD 1 each by Intrauterine route once.    [provider]  MENATETRENONE PO Take 1 tablet by mouth every morning. 01/17/23   [provider]  Multiple Vitamins-Minerals (MULTIVITAMIN WITH MINERALS) tablet Take 1 tablet by mouth daily.    [provider]  naltrexone (DEPADE) 50 MG tablet Take 1 tablet (50 mg total) by mouth daily. Start nightly at half dose (25 mg), x 3 days then increase to 50 mg nightly 03/05/23 03/04/24  [provider]  oxyCODONE -acetaminophen  (PERCOCET/ROXICET) 5-325 MG tablet Take 1-2 tablets by mouth every 6 (six) hours as needed for severe pain (pain score 7-10). 07/27/23   Ethlyn Herd, MD  pantoprazole  (PROTONIX ) 40 MG tablet TAKE 1 TABLET BY MOUTH TWICE A DAY 11/20/22   Matthews, Jason J, MD  tiZANidine  (ZANAFLEX ) 4 MG tablet Take 1 tablet (4 mg total) by mouth every 8 (eight) hours as needed for muscle spasms. 07/27/23   Mortenson, Ashley, MD  Venlafaxine HCl (EFFEXOR XR PO) Take 75 mg by mouth daily.    [provider]  Vitamin D , Ergocalciferol , (DRISDOL ) 1.25 MG (50000 UNIT) CAPS capsule Take 1 capsule (50,000 Units total) by mouth every 7 (seven) days. Take for 8 total doses(weeks) 05/05/22   Matthews, Jason J, MD  escitalopram  (LEXAPRO ) 20 MG tablet Take 1 tablet by mouth daily. 02/28/18 08/26/19  [provider]  lamoTRIgine  (LAMICTAL ) 100 MG tablet Take 200 mg by mouth 2 (two) times daily.  01/15/18 08/26/19  [provider]  levETIRAcetam  (KEPPRA ) 500 MG tablet Take 1,500 mg by mouth 2 (two) times daily.  01/15/18 08/26/19  [provider]  omeprazole  (PRILOSEC) 20 MG capsule Take 1 capsule (20 mg total) by mouth daily. 04/24/18 08/26/19  Christina Coyer, MD    Family History Family History  Problem Relation Age of Onset   Healthy Mother    Hyperlipidemia Father      Social History Social History   Tobacco Use   Smoking status: Never   Smokeless tobacco: Never  Vaping Use   Vaping status: Never Used  Substance Use Topics   Alcohol use: Not Currently    Comment: social   Drug use: Never     Allergies   Dilantin [phenytoin], Penicillins, and Gluten meal   Review of Systems Review of Systems  Constitutional:  Positive for fever.       Malaise  Skin:  Positive for rash.     Physical Exam Triage Vital Signs ED Triage Vitals  Encounter Vitals Group     BP 08/21/23 1245 (!) 150/103     Girls Systolic BP Percentile --      Girls Diastolic BP  Percentile --      Boys Systolic BP Percentile --      Boys Diastolic BP Percentile --      Pulse Rate 08/21/23 1246 100     Resp 08/21/23 1246 14     Temp 08/21/23 1246 98.9 F (37.2 C)     Temp Source 08/21/23 1246 Oral     SpO2 08/21/23 1246 98 %     Weight 08/21/23 1244 107 lb (48.5 kg)     Height 08/21/23 1244 5' 5 (1.651 m)     Head Circumference --      Peak Flow --      Pain Score 08/21/23 1244 0     Pain Loc --      Pain Education --      Exclude from Growth Chart --    No data found.  Updated Vital Signs BP (!) 160/106 (BP Location: Left Arm)   Pulse 100   Temp 98.9 F (37.2 C) (Oral)   Resp 14   Ht 5' 5 (1.651 m)   Wt 107 lb (48.5 kg)   SpO2 98%   BMI 17.81 kg/m   Visual Acuity Right Eye Distance:   Left Eye Distance:   Bilateral Distance:    Right Eye Near:   Left Eye Near:    Bilateral Near:     Physical Exam Vitals and nursing note reviewed.  Constitutional:      General: She is not in acute distress.    Appearance: She is well-developed. She is not ill-appearing.  HENT:     Head: Normocephalic and atraumatic.     Right Ear: Tympanic membrane and ear canal normal.     Left Ear: Tympanic membrane and ear canal normal.     Nose: No congestion.     Mouth/Throat:     Mouth: Mucous membranes are moist.     Pharynx: Oropharynx is clear. Uvula  midline. No posterior oropharyngeal erythema.     Tonsils: No tonsillar exudate or tonsillar abscesses.   Eyes:     Conjunctiva/sclera: Conjunctivae normal.     Pupils: Pupils are equal, round, and reactive to light.    Cardiovascular:     Rate and Rhythm: Normal rate and regular rhythm.     Heart sounds: Normal heart sounds.  Pulmonary:     Effort: Pulmonary effort is normal. No respiratory distress.     Breath sounds: Normal breath sounds. No stridor. No wheezing, rhonchi or rales.     Comments: Equal chest expansion bilaterally  Musculoskeletal:     Cervical back: Normal range of motion and neck supple.  Lymphadenopathy:     Cervical: No cervical adenopathy.   Skin:    General: Skin is warm and dry.     Comments: There is a very faint mildly erythematous macular papular rash on the right cheek.  There is no swelling, drainage.  No notable rashes on  neck, torso, arms or legs.   Neurological:     General: No focal deficit present.     Mental Status: She is alert and oriented to person, place, and time.   Psychiatric:        Mood and Affect: Mood normal.        Behavior: Behavior normal.      UC Treatments / Results  Labs (all labs ordered are listed, but only abnormal results are displayed) Labs Reviewed  URINALYSIS, W/ REFLEX TO CULTURE (INFECTION SUSPECTED) - Abnormal; Notable for the following components:  Result Value   Glucose, UA 500 (*)    Protein, ur 30 (*)    Bacteria, UA FEW (*)    All other components within normal limits  CBC WITH DIFFERENTIAL/PLATELET - Abnormal; Notable for the following components:   RBC 2.73 (*)    Hemoglobin 9.8 (*)    HCT 26.6 (*)    MCH 35.9 (*)    MCHC 36.8 (*)    All other components within normal limits  COMPREHENSIVE METABOLIC PANEL WITH GFR - Abnormal; Notable for the following components:   Sodium 123 (*)    Chloride 87 (*)    CO2 18 (*)    Glucose, Bld 122 (*)    Total Protein 9.1 (*)    AST 43 (*)     Alkaline Phosphatase 168 (*)    Anion gap 18 (*)    All other components within normal limits    EKG   Radiology DG Chest 2 View Result Date: 08/21/2023 CLINICAL DATA:  Recent rib fracture.  Fevers and malaise. EXAM: CHEST - 2 VIEW COMPARISON:  07/27/2023. FINDINGS: The heart size and mediastinal contours are within normal limits. There is atherosclerotic calcification of the aorta. No consolidation, effusion, or pneumothorax is seen. Stable coils are present in the upper abdomen. The bony structures are stable. IMPRESSION: No active cardiopulmonary disease. Electronically Signed   By: Wyvonnia Heimlich M.D.   On: 08/21/2023 13:37    Procedures Procedures (including critical care time)  Medications Ordered in UC Medications - No data to display  Initial Impression / Assessment and Plan / UC Course  I have reviewed the triage vital signs and the nursing notes.  Pertinent labs & imaging results that were available during my care of the patient were reviewed by me and considered in my medical decision making (see chart for details).     Reviewed exam and symptoms with patient.  Chest x-ray without any consolidation.  Blood work shows chronic anemia but stable compared to lab work done from May.  W BC's are not elevated.  Patient is hyponatremic with a sodium of 123 and her most recent lab work from 1 month ago shows normal sodium levels.  Unclear cause of her persistent low-grade fevers x 2 weeks as well as malaise.  Given her blood work, medical history, symptoms advise she go to the emergency room for further workup.  She is in agreement with plan.  She declines EMS transfer will go POV to [facility] room.  She was instructed to pull over and call 911 for any worsening symptoms that occur in transit and she verbalized understanding. Final Clinical Impressions(s) / UC Diagnoses   Final diagnoses:  Fever, unspecified  Hyponatremia  Malaise     Discharge Instructions       Please go to the ER for further evaluation of your symptoms     ED Prescriptions   None    PDMP not reviewed this encounter.   Alleen Arbour, NP 08/21/23 [phone]

## 2023-08-21 NOTE — ED Triage Notes (Signed)
 Patient reports low grade fevers for 10 days.  Patient reports temp has been 99.7.  Patient reports non-itchy rash on her body that started last week and only last 3 days.  Patient reports a rash on her face since this morning. Patient denies any cold symptoms.

## 2023-09-27 ENCOUNTER — Other Ambulatory Visit: Payer: Self-pay | Admitting: Family Medicine

## 2023-09-27 NOTE — Telephone Encounter (Unsigned)
 Copied from CRM [id number]. Topic: Clinical - Medication Refill >> Sep 27, 2023  5:01 PM Chiquita SQUIBB wrote: Medication: tiZANidine  (ZANAFLEX ) 4 MG tablet [[id number]]  Patient has recently broke her leg, and have ran out but can not get in touch with her ER doctor. She was then recommended to contact her PCP  Has the patient contacted their pharmacy? Yes (Agent: If no, request that the patient contact the pharmacy for the refill. If patient does not wish to contact the pharmacy document the reason why and proceed with request.) (Agent: If yes, when and what did the pharmacy advise?)  This is the patient's preferred pharmacy:  [id number] - [place],  - [address] [place] [address] Phone: [phone] Fax: [phone]  Is this the correct pharmacy for this prescription? Yes If no, delete pharmacy and type the correct one.   Has the prescription been filled recently? No  Is the patient out of the medication? Yes  Has the patient been seen for an appointment in the last year OR does the patient have an upcoming appointment? Yes  Can we respond through MyChart? Yes  Agent: Please be advised that Rx refills may take up to 3 business days. We ask that you follow-up with your pharmacy.

## 2023-09-28 ENCOUNTER — Encounter: Admitting: Family Medicine

## 2023-09-28 ENCOUNTER — Telehealth: Payer: Self-pay | Admitting: Family Medicine

## 2023-09-28 NOTE — Telephone Encounter (Signed)
    Primary Care / Sports Medicine Telephone Note  Patient Information:  Patient ID: Crystal Mckinney, female DOB: Aug 28, 1975 Age: 48 y.o. MRN: [id number]    Please contact patient and find out what medication she was trying to have filled/refilled from today's visit.  If it was the recently prescribed pain medications for her recent left ankle fracture managed by  [facility] orth[facility], she will need to contact them for fracture related pain control, etc.  If there is some other medication that she had previously been on through our office, please let me know so that we can coordinate appropriately.  Selinda JINNY Ku, MD, [facility]   Primary Care Sports Medicine Primary Care and Sports Medicine at [facility]

## 2023-09-28 NOTE — Telephone Encounter (Signed)
 Huxtable!!!

## 2023-09-28 NOTE — Progress Notes (Signed)
ERRONEOUS

## 2023-09-29 NOTE — Telephone Encounter (Signed)
 Requested medication (s) are due for refill today:   Provider to review  Requested medication (s) are on the active medication list:   Yes  Future visit scheduled:   Yes 7/24 with Dr. Alvia   Last ordered: 07/27/2023 #30, 0 refills   Non delegated refill        Requested Prescriptions  Pending Prescriptions Disp Refills   tiZANidine  (ZANAFLEX ) 4 MG tablet 30 tablet 0    Sig: Take 1 tablet (4 mg total) by mouth every 8 (eight) hours as needed for muscle spasms.     Not Delegated - Cardiovascular:  Alpha-2 Agonists - tizanidine  Failed - 09/29/2023  3:45 PM      Failed - This refill cannot be delegated      Failed - Valid encounter within last 6 months    Recent Outpatient Visits   None     Future Appointments             Tomorrow Alvia Selinda PARAS, MD [facility] Primary Care & Sports Medicine at [facility]

## 2023-09-30 ENCOUNTER — Telehealth (INDEPENDENT_AMBULATORY_CARE_PROVIDER_SITE_OTHER): Admitting: Family Medicine

## 2023-09-30 ENCOUNTER — Encounter: Payer: Self-pay | Admitting: Family Medicine

## 2023-09-30 DIAGNOSIS — S20212A Contusion of left front wall of thorax, initial encounter: Secondary | ICD-10-CM | POA: Diagnosis not present

## 2023-09-30 DIAGNOSIS — M8080XA Other osteoporosis with current pathological fracture, unspecified site, initial encounter for fracture: Secondary | ICD-10-CM | POA: Diagnosis not present

## 2023-09-30 DIAGNOSIS — F32A Depression, unspecified: Secondary | ICD-10-CM

## 2023-09-30 DIAGNOSIS — F419 Anxiety disorder, unspecified: Secondary | ICD-10-CM

## 2023-09-30 MED ORDER — TIZANIDINE HCL 4 MG PO TABS: 4.0000 mg | ORAL_TABLET | Freq: Three times a day (TID) | ORAL | 0 refills | Status: AC | PRN

## 2023-10-02 DIAGNOSIS — S20212A Contusion of left front wall of thorax, initial encounter: Secondary | ICD-10-CM | POA: Insufficient documentation

## 2023-10-02 DIAGNOSIS — M8000XA Age-related osteoporosis with current pathological fracture, unspecified site, initial encounter for fracture: Secondary | ICD-10-CM | POA: Insufficient documentation

## 2023-10-02 MED ORDER — LIDOCAINE-CAPSAICIN 4.75-0.025 % EX PTCH: 1.0000 | MEDICATED_PATCH | Freq: Every day | CUTANEOUS | 0 refills | Status: DC | PRN

## 2023-10-02 NOTE — Assessment & Plan Note (Signed)
 Rib pain - Significant pain localized to the lower left ribs, exacerbated by sneezing, coughing, or blowing her nose. - Unable to lie on her left side due to pain, resulting in disrupted sleep. - Utilizes ice and rib splinting techniques for pain management.  Contusion of left lower rib Rib contusion causing significant pain with movement. - Consider lidocaine  patches for pain relief. - Send prescription for capsaicin  compound patch to use if covered by insurance.

## 2023-10-02 NOTE — Patient Instructions (Signed)
 VISIT SUMMARY:  Today, you were seen for a left ankle fracture and rib pain following a fall. We discussed your ongoing issues with osteopenia and depression, and made plans to address each of these concerns.  YOUR PLAN:  FRACTURE OF LEFT ANKLE: You have a fracture in your left ankle from your recent fall. -Your ankle fracture is being managed with a cast by [employer].  CONTUSION OF RIB: You have a rib injury causing significant pain, especially with movement. -Use non-medication rib splinting techniques to help manage the pain. -Consider using lidocaine  patches for pain relief. -We will send a prescription for a capsaicin  patch if it is covered by your insurance.  OSTEOPOROSIS WITH CURRENT PATHOLOGICAL FRACTURE: Your osteopenia has led to multiple fractures, and your medication for epilepsy is contributing to decreased bone density. -We will refer you to [facility] for osteoporosis treatment. -We will coordinate with [facility] to explore potential osteoporosis treatment options, including infusion therapies.  EPILEPSY: Your epilepsy is well managed with Tegretol , but it is affecting your bone density. -We need to balance your seizure control with maintaining your bone health.  DEPRESSION: You are experiencing significant stress and emotional burden, and your current medication for depression is less effective. -We will refer you to behavioral therapy for talk therapy. -We discussed the potential addition of bupropion or Vraylar, talk with your neurology team to ensure there are no interactions with your epilepsy medication. -We will await input from neurology before prescribing a new antidepressant.

## 2023-10-02 NOTE — Assessment & Plan Note (Signed)
 Musculoskeletal trauma - Sustained a left ankle fracture and lower rib contusion on July 13th after a fall when the back wheels of her walker caught on a bathroom rug, resulting in loss of balance. - Initially experienced predominant left ankle pain, with rib pain recognized later. - History of multiple fractures, including three ribs from a previous fall involving her dog. - Bones are prone to fracture rather than bruising due to underlying osteopenia.  Osteopenia and bone health - Diagnosed with osteopenia, with increased fracture risk. - Long-term use of carbamazepine  (Tegretol ) for epilepsy has contributed to decreased bone density. - Bone density monitored with DEXA scans by her neurologist at [facility].  Osteoporosis with current pathological fracture Osteoporosis with multiple fractures exacerbated by Tegretol  use. Recent DEXA scan showed osteopenia. Discussed need for aggressive treatment and potential adjunct therapies. - Refer to [facility] for osteoporosis treatment. - Coordinate with [facility] for potential osteoporosis treatment options, including additional therapies.

## 2023-10-02 NOTE — Progress Notes (Signed)
 Primary Care / Sports Medicine Virtual Visit  Patient Information:  Patient ID: Crystal Mckinney, female DOB: Mar 29, 1975 Age: 48 y.o. MRN: [id number]   Crystal Mckinney is a pleasant 48 y.o. female presenting with the following:  Chief Complaint  Patient presents with   Leg Injury    Patient had a fall 09/19/23 and broke her left leg (tibia). She is casted and will get Cast off 10/18/23 and re casted for another 3-5 weeks.    New Med Request    Patient requesting tizanidine      Review of Systems: No fevers, chills, night sweats, weight loss, chest pain, or shortness of breath.   Patient Active Problem List   Diagnosis Date Noted   Contusion of rib on left side 10/02/2023   Osteoporosis with current pathological fracture 10/02/2023   Celiac disease 05/02/2022   GERD (gastroesophageal reflux disease) 05/02/2022   IBS (irritable bowel syndrome) 05/02/2022   Anxiety and depression 05/01/2022   Annual physical exam 05/01/2022   Actinic keratoses 05/01/2022   Exocrine pancreatic insufficiency 01/20/2022   PUD (peptic ulcer disease) 01/20/2022   Seizures (HCC) 01/20/2022   Past Medical History:  Diagnosis Date   ADD (attention deficit disorder)    Anxiety    Cancer (HCC)    Celiac disease    GERD (gastroesophageal reflux disease)    IBS (irritable bowel syndrome)    Malnutrition (HCC)    Seizures (HCC)    Outpatient Encounter Medications as of 09/30/2023  Medication Sig   Lidocaine -Capsaicin  4.75-0.025 % PTCH Apply 1 patch topically daily as needed. Apply one patch to the affected area once daily. Leave on for up to 12 hours in a 24-hour period, then remove. Apply to clean, dry, intact skin only. Do not use more than one patch at a time.   bisacodyl  (DULCOLAX) 10 MG suppository Place rectally.   carbamazepine  (TEGRETOL  XR) 200 MG 12 hr tablet Take 200 mg by mouth 2 (two) times daily. 2 tablets in morning and 2 tablets at night.   cloBAZam (ONFI) 10 MG tablet Take 10 mg by mouth  at bedtime.   CREON  36000-114000 units CPEP capsule 2 CAPSULES BY MOUTH THREE TIMES A DAY WITH MEALS, 1 CAPSULE BY MOUTH AS NEEDED WITH SNACKS   cyanocobalamin (VITAMIN B12) 1000 MCG/ML injection Inject 1,000 mcg into the muscle every 30 (thirty) days.   dicyclomine (BENTYL) 10 MG capsule Take 10 mg by mouth 3 (three) times daily.   folic acid (FOLVITE) 1 MG tablet Take 1 tablet by mouth daily.   hydrOXYzine (ATARAX) 25 MG tablet Take by mouth.   levonorgestrel (MIRENA) 20 MCG/24HR IUD 1 each by Intrauterine route once.   MENATETRENONE PO Take 1 tablet by mouth every morning.   Multiple Vitamins-Minerals (MULTIVITAMIN WITH MINERALS) tablet Take 1 tablet by mouth daily.   naltrexone (DEPADE) 50 MG tablet Take 1 tablet (50 mg total) by mouth daily. Start nightly at half dose (25 mg), x 3 days then increase to 50 mg nightly   oxyCODONE -acetaminophen  (PERCOCET/ROXICET) 5-325 MG tablet Take 1-2 tablets by mouth every 6 (six) hours as needed for severe pain (pain score 7-10).   pantoprazole  (PROTONIX ) 40 MG tablet TAKE 1 TABLET BY MOUTH TWICE A DAY   tiZANidine  (ZANAFLEX ) 4 MG tablet Take 1 tablet (4 mg total) by mouth every 8 (eight) hours as needed for muscle spasms.   Venlafaxine HCl (EFFEXOR XR PO) Take 75 mg by mouth daily.   Vitamin D , Ergocalciferol , (DRISDOL )  1.25 MG (50000 UNIT) CAPS capsule Take 1 capsule (50,000 Units total) by mouth every 7 (seven) days. Take for 8 total doses(weeks)   [DISCONTINUED] azithromycin  (ZITHROMAX ) 250 MG tablet Take 1 tablet (250 mg total) by mouth daily. 2 tabs po on day 1, 1 tab po on days 2-5   [DISCONTINUED] escitalopram  (LEXAPRO ) 20 MG tablet Take 1 tablet by mouth daily.   [DISCONTINUED] lamoTRIgine  (LAMICTAL ) 100 MG tablet Take 200 mg by mouth 2 (two) times daily.    [DISCONTINUED] levETIRAcetam  (KEPPRA ) 500 MG tablet Take 1,500 mg by mouth 2 (two) times daily.    [DISCONTINUED] omeprazole  (PRILOSEC) 20 MG capsule Take 1 capsule (20 mg total) by mouth  daily.   [DISCONTINUED] tiZANidine  (ZANAFLEX ) 4 MG tablet Take 1 tablet (4 mg total) by mouth every 8 (eight) hours as needed for muscle spasms.   No facility-administered encounter medications on file as of 09/30/2023.   Past Surgical History:  Procedure Laterality Date   ABDOMINAL SURGERY     HEMATOMA EVACUATION     HIP SURGERY Left     Virtual Visit via MyChart Video:   I connected with SHANAYAH KAFFENBERGER on 10/02/23 via MyChart Video and verified that I am speaking with the correct person using appropriate identifiers.   The limitations, risks, security and privacy concerns of performing an evaluation and management service by MyChart Video, including the higher likelihood of inaccurate diagnoses and treatments, and the availability of in person appointments were reviewed. The possible need of an additional face-to-face encounter for complete and high quality delivery of care was discussed. The patient was also made aware that there may be a patient responsible charge related to this service. The patient expressed understanding and wishes to proceed.  Provider location is in medical facility. Patient location is at their home, different from provider location. People involved in care of the patient during this telehealth encounter were myself, my nurse/medical assistant, and my front office/scheduling team member.  Objective findings:   General: Speaking full sentences, no audible heavy breathing. Sounds alert and appropriately interactive. Well-appearing. Face symmetric. Extraocular movements intact. Pupils equal and round. No nasal flaring or accessory muscle use visualized.  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 - Feels 'slow' and emotionally burdened by recent events, including her father's passing and her mother's absence during that time. - On Effexor for many years for mental health, but perceives it is no longer effective.  Anxiety and Depression Chronic depression managed with Effexor, now less effective. Recent stressors include father's death. Discussed potential addition of bupropion or Vraylar, considering epilepsy medication interactions. - Refer to behavioral therapy for talk therapy. - Discuss potential addition of bupropion or Vraylar with neurology to ensure no interaction with epilepsy medication. - Await neurology's input before prescribing new antidepressant.      Relevant Orders   Ambulatory referral to Psychology   Contusion of rib on left side   Rib pain - Significant pain localized to the lower left ribs, exacerbated by sneezing, coughing, or blowing her nose. - Unable to lie on her left side due to pain, resulting in disrupted sleep. - Utilizes ice and rib splinting techniques for pain management.  Contusion of left lower rib Rib contusion causing significant pain with movement. - Consider lidocaine  patches for pain relief. - Send prescription for capsaicin  compound patch to use if covered by insurance.      Relevant Medications   tiZANidine  (ZANAFLEX ) 4 MG tablet   Lidocaine -Capsaicin  4.75-0.025 % PTCH   Osteoporosis with current pathological fracture - Primary   Musculoskeletal trauma - Sustained a left ankle fracture and lower rib contusion on July 13th after a fall when the back wheels of her walker caught on a bathroom rug, resulting in loss of balance. - Initially experienced predominant left ankle pain, with rib pain recognized later. - History of multiple fractures, including three ribs from a previous fall involving her dog. - Bones are prone to fracture rather than bruising due to underlying  osteopenia.  Osteopenia and bone health - Diagnosed with osteopenia, with increased fracture risk. - Long-term use of carbamazepine  (Tegretol ) for epilepsy has contributed to decreased bone density. - Bone density monitored with DEXA scans by her neurologist at [facility].  Osteoporosis with current pathological fracture Osteoporosis with multiple fractures exacerbated by Tegretol  use. Recent DEXA scan showed osteopenia. Discussed need for aggressive treatment and potential adjunct therapies. - Refer to [facility] for osteoporosis treatment. - Coordinate with [facility] for potential osteoporosis treatment options, including additional therapies.      Relevant Orders   Ambulatory referral to Orthopedic Surgery     Orders & Medications Medications:  Meds ordered this encounter  Medications   tiZANidine  (ZANAFLEX ) 4 MG tablet    Sig: Take 1 tablet (4 mg total) by mouth every 8 (eight) hours as needed for muscle spasms.    Dispense:  60 tablet    Refill:  0   Lidocaine -Capsaicin  4.75-0.025 % PTCH    Sig: Apply 1 patch topically daily as needed. Apply one patch to the affected area once daily. Leave on for up to 12 hours in a 24-hour period, then remove. Apply to clean, dry, intact skin only. Do not use more than one patch at a time.    Dispense:  32 patch    Refill:  0   Orders Placed This Encounter  Procedures   Ambulatory referral to Orthopedic Surgery   Ambulatory referral to Psychology     I discussed the above assessment and treatment plan with the patient. The patient was provided an opportunity to ask questions and all were answered. The patient agreed with the plan and demonstrated an understanding of the instructions.   The patient was advised to call back or seek an in-person evaluation if the symptoms worsen or if the condition fails to improve as anticipated.   I provided a total time of 46 minutes including both face-to-face and non-face-to-face time on 10/02/2023  inclusive of time utilized for medical chart review, information gathering, care coordination with staff, and documentation completion.    Selinda JINNY Ku, MD, [facility]   Primary Care Sports Medicine Primary Care and Sports Medicine at [facility]

## 2023-10-02 NOTE — Assessment & Plan Note (Signed)
 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 - Feels 'slow' and emotionally burdened by recent events, including her father's passing and her mother's absence during that time. - On Effexor for many years for mental health, but perceives it is no longer effective.  Anxiety and Depression Chronic depression managed with Effexor, now less effective. Recent stressors include father's death. Discussed potential addition of bupropion or Vraylar, considering epilepsy medication interactions. - Refer to behavioral therapy for talk therapy. - Discuss potential addition of bupropion or Vraylar with neurology to ensure no interaction with epilepsy medication. - Await neurology's input before prescribing new antidepressant.

## 2023-10-07 ENCOUNTER — Other Ambulatory Visit: Payer: Self-pay | Admitting: Family Medicine

## 2023-10-07 ENCOUNTER — Telehealth: Payer: Self-pay

## 2023-10-07 DIAGNOSIS — Z0000 Encounter for general adult medical examination without abnormal findings: Secondary | ICD-10-CM

## 2023-10-07 DIAGNOSIS — R7309 Other abnormal glucose: Secondary | ICD-10-CM

## 2023-10-07 DIAGNOSIS — Z136 Encounter for screening for cardiovascular disorders: Secondary | ICD-10-CM

## 2023-10-07 DIAGNOSIS — E559 Vitamin D deficiency, unspecified: Secondary | ICD-10-CM

## 2023-10-07 DIAGNOSIS — D649 Anemia, unspecified: Secondary | ICD-10-CM

## 2023-10-07 NOTE — Telephone Encounter (Signed)
 Left message on machine for patient to call back. She has lab orders that she needs drawn before she can make a appt with [facility] for her osteoporosis.   JM

## 2023-10-14 ENCOUNTER — Telehealth: Payer: Self-pay | Admitting: Family Medicine

## 2023-10-14 ENCOUNTER — Telehealth: Payer: Self-pay

## 2023-10-14 MED ORDER — ARIPIPRAZOLE 2 MG PO TABS: 2.0000 mg | ORAL_TABLET | Freq: Every day | ORAL | 0 refills | Status: DC

## 2023-10-14 NOTE — Telephone Encounter (Signed)
 Copied from CRM [id number]. Topic: Clinical - Medication Question >> Oct 14, 2023 12:54 PM Fonda T wrote: Reason for CRM: Received call from [facility] with [facility], states there was office notes sent to the office, on this morning, to inquire on primary care provider recommendation of prescribing depression medication for patient.  Faith is inquiring if forms were received, and to also advise provider's response/recommendation.  Faith can be reached at [phone].

## 2023-10-14 NOTE — Telephone Encounter (Signed)
 Completed.

## 2023-10-18 ENCOUNTER — Encounter: Payer: Self-pay | Admitting: Family Medicine

## 2023-10-18 NOTE — Telephone Encounter (Signed)
 Please review and send RX.  JM

## 2023-11-11 ENCOUNTER — Other Ambulatory Visit: Payer: Self-pay | Admitting: Family Medicine

## 2023-11-11 ENCOUNTER — Encounter: Payer: Self-pay | Admitting: Family Medicine

## 2023-11-11 ENCOUNTER — Telehealth: Admitting: Family Medicine

## 2023-11-11 NOTE — Telephone Encounter (Signed)
 Requested medications are due for refill today.  yes  Requested medications are on the active medications list.  yes  Last refill. 10/14/2023 #30 0 rf  Future visit scheduled.   no  Notes to clinic.  Refill not delegated.    Requested Prescriptions  Pending Prescriptions Disp Refills   ARIPiprazole  (ABILIFY ) 2 MG tablet [Pharmacy Med Name: ARIPIPRAZOLE  2 MG TABLET] 90 tablet 1    Sig: TAKE 1 TABLET BY MOUTH EVERY DAY     Not Delegated - Psychiatry:  Antipsychotics - Second Generation (Atypical) - aripiprazole  Failed - 11/11/2023  3:13 PM      Failed - This refill cannot be delegated      Failed - TSH in normal range and within 360 days    TSH  Date Value Ref Range Status  05/01/2022 0.511 0.450 - 4.500 uIU/mL Final         Failed - Last BP in normal range    BP Readings from Last 1 Encounters:  08/21/23 (!) 160/106         Failed - Lipid Panel in normal range within the last 12 months    Cholesterol, Total  Date Value Ref Range Status  05/01/2022 241 (H) 100 - 199 mg/dL Final   LDL Chol Calc (NIH)  Date Value Ref Range Status  05/01/2022 83 0 - 99 mg/dL Final   HDL  Date Value Ref Range Status  05/01/2022 148 >39 mg/dL Final   Triglycerides  Date Value Ref Range Status  05/01/2022 63 0 - 149 mg/dL Final         Passed - Completed PHQ-2 or PHQ-9 in the last 360 days      Passed - Last Heart Rate in normal range    Pulse Readings from Last 1 Encounters:  08/21/23 100         Passed - Valid encounter within last 6 months    Recent Outpatient Visits           1 month ago Other osteoporosis with current pathological fracture, initial encounter   [place] [facility] [place], Selinda PARAS, MD              Passed - CBC within normal limits and completed in the last 12 months    WBC  Date Value Ref Range Status  08/21/2023 4.2 4.0 - 10.5 K/uL Final   RBC  Date Value Ref Range Status  08/21/2023 2.73 (L) 3.87 - 5.11  MIL/uL Final   Hemoglobin  Date Value Ref Range Status  08/21/2023 9.8 (L) 12.0 - 15.0 g/dL Final  97/76/7975 9.0 (L) 11.1 - 15.9 g/dL Final   HCT  Date Value Ref Range Status  08/21/2023 26.6 (L) 36.0 - 46.0 % Final   Hematocrit  Date Value Ref Range Status  05/01/2022 25.6 (L) 34.0 - 46.6 % Final   MCHC  Date Value Ref Range Status  08/21/2023 36.8 (H) 30.0 - 36.0 g/dL Final   [facility]  Date Value Ref Range Status  08/21/2023 35.9 (H) 26.0 - 34.0 pg Final   MCV  Date Value Ref Range Status  08/21/2023 97.4 80.0 - 100.0 fL Final  05/01/2022 110 (H) 79 - 97 fL Final   No results found for: PLTCOUNTKUC, LABPLAT, POCPLA RDW  Date Value Ref Range Status  08/21/2023 12.2 11.5 - 15.5 % Final  05/01/2022 12.1 11.7 - 15.4 % Final         Passed -  CMP within normal limits and completed in the last 12 months    Albumin  Date Value Ref Range Status  08/21/2023 5.0 3.5 - 5.0 g/dL Final  97/76/7975 4.6 3.9 - 4.9 g/dL Final   Alkaline Phosphatase  Date Value Ref Range Status  08/21/2023 168 (H) 38 - 126 U/L Final   ALT  Date Value Ref Range Status  08/21/2023 19 0 - 44 U/L Final   AST  Date Value Ref Range Status  08/21/2023 43 (H) 15 - 41 U/L Final   BUN  Date Value Ref Range Status  08/21/2023 18 6 - 20 mg/dL Final  97/76/7975 11 6 - 24 mg/dL Final   Calcium  Date Value Ref Range Status  08/21/2023 9.6 8.9 - 10.3 mg/dL Final   CO2  Date Value Ref Range Status  08/21/2023 18 (L) 22 - 32 mmol/L Final   Creatinine, Ser  Date Value Ref Range Status  08/21/2023 0.70 0.44 - 1.00 mg/dL Final   Glucose, Bld  Date Value Ref Range Status  08/21/2023 122 (H) 70 - 99 mg/dL Final    Comment:    Glucose reference range applies only to samples taken after fasting for at least 8 hours.   Potassium  Date Value Ref Range Status  08/21/2023 4.0 3.5 - 5.1 mmol/L Final   Sodium  Date Value Ref Range Status  08/21/2023 123 (L) 135 - 145 mmol/L Final  05/01/2022 133  (L) 134 - 144 mmol/L Final   Total Bilirubin  Date Value Ref Range Status  08/21/2023 0.3 0.0 - 1.2 mg/dL Final   Bilirubin Total  Date Value Ref Range Status  05/01/2022 <0.2 0.0 - 1.2 mg/dL Final   Protein, ur  Date Value Ref Range Status  08/21/2023 30 (A) NEGATIVE mg/dL Final   Total Protein  Date Value Ref Range Status  08/21/2023 9.1 (H) 6.5 - 8.1 g/dL Final  97/76/7975 7.2 6.0 - 8.5 g/dL Final   GFR calc Af Amer  Date Value Ref Range Status  08/26/2019 >60 >60 mL/min Final   eGFR  Date Value Ref Range Status  05/01/2022 118 >59 mL/min/1.73 Final   GFR, Estimated  Date Value Ref Range Status  08/21/2023 >60 >60 mL/min Final    Comment:    (NOTE) Calculated using the CKD-EPI Creatinine Equation (2021)

## 2023-11-29 ENCOUNTER — Telehealth: Admitting: Family Medicine

## 2023-12-01 NOTE — Telephone Encounter (Signed)
 FYI

## 2023-12-07 ENCOUNTER — Ambulatory Visit: Admitting: Family Medicine

## 2023-12-08 ENCOUNTER — Ambulatory Visit: Admitting: Family Medicine

## 2023-12-09 ENCOUNTER — Ambulatory Visit: Admitting: Family Medicine

## 2023-12-20 ENCOUNTER — Ambulatory Visit: Admitting: Family Medicine

## 2023-12-20 ENCOUNTER — Encounter: Payer: Self-pay | Admitting: Family Medicine

## 2023-12-20 VITALS — BP 126/62 | HR 75 | Ht 65.0 in | Wt 107.0 lb

## 2023-12-20 DIAGNOSIS — D649 Anemia, unspecified: Secondary | ICD-10-CM

## 2023-12-20 DIAGNOSIS — L2989 Other pruritus: Secondary | ICD-10-CM | POA: Diagnosis not present

## 2023-12-20 DIAGNOSIS — F32A Depression, unspecified: Secondary | ICD-10-CM

## 2023-12-20 DIAGNOSIS — R569 Unspecified convulsions: Secondary | ICD-10-CM

## 2023-12-20 DIAGNOSIS — F1090 Alcohol use, unspecified, uncomplicated: Secondary | ICD-10-CM | POA: Diagnosis not present

## 2023-12-20 DIAGNOSIS — E46 Unspecified protein-calorie malnutrition: Secondary | ICD-10-CM

## 2023-12-20 DIAGNOSIS — M8080XD Other osteoporosis with current pathological fracture, unspecified site, subsequent encounter for fracture with routine healing: Secondary | ICD-10-CM

## 2023-12-20 DIAGNOSIS — I21B Myocardial infarction with coronary microvascular dysfunction: Secondary | ICD-10-CM

## 2023-12-20 DIAGNOSIS — R590 Localized enlarged lymph nodes: Secondary | ICD-10-CM | POA: Diagnosis not present

## 2023-12-20 DIAGNOSIS — K859 Acute pancreatitis without necrosis or infection, unspecified: Secondary | ICD-10-CM

## 2023-12-20 DIAGNOSIS — K760 Fatty (change of) liver, not elsewhere classified: Secondary | ICD-10-CM

## 2023-12-20 DIAGNOSIS — F419 Anxiety disorder, unspecified: Secondary | ICD-10-CM

## 2023-12-20 NOTE — Progress Notes (Signed)
 Crystal Mckinney

## 2023-12-22 DIAGNOSIS — K859 Acute pancreatitis without necrosis or infection, unspecified: Secondary | ICD-10-CM | POA: Insufficient documentation

## 2023-12-22 DIAGNOSIS — E46 Unspecified protein-calorie malnutrition: Secondary | ICD-10-CM | POA: Insufficient documentation

## 2023-12-22 DIAGNOSIS — K760 Fatty (change of) liver, not elsewhere classified: Secondary | ICD-10-CM | POA: Insufficient documentation

## 2023-12-22 DIAGNOSIS — I214 Non-ST elevation (NSTEMI) myocardial infarction: Secondary | ICD-10-CM | POA: Insufficient documentation

## 2023-12-22 DIAGNOSIS — D649 Anemia, unspecified: Secondary | ICD-10-CM | POA: Insufficient documentation

## 2023-12-22 DIAGNOSIS — F1090 Alcohol use, unspecified, uncomplicated: Secondary | ICD-10-CM | POA: Insufficient documentation

## 2023-12-22 DIAGNOSIS — L2989 Other pruritus: Secondary | ICD-10-CM | POA: Insufficient documentation

## 2023-12-22 DIAGNOSIS — K861 Other chronic pancreatitis: Secondary | ICD-10-CM | POA: Insufficient documentation

## 2023-12-22 DIAGNOSIS — I21B Myocardial infarction with coronary microvascular dysfunction: Secondary | ICD-10-CM | POA: Insufficient documentation

## 2023-12-22 DIAGNOSIS — R590 Localized enlarged lymph nodes: Secondary | ICD-10-CM | POA: Insufficient documentation

## 2023-12-22 NOTE — Patient Instructions (Addendum)
 VISIT SUMMARY:  Today, we discussed your recent heart attack and various ongoing health issues, including recurrent pancreatitis, osteoporosis, and pruritus. We reviewed your current medications and made plans for follow-up appointments and additional tests to manage your conditions effectively.  YOUR PLAN:  MYOCARDIAL INFARCTION WITH MICROVASCULAR DYSFUNCTION: You recently had a heart attack due to microvascular disease, even though your main coronary arteries are normal. -Continue taking losartan 25 mg daily. -Continue taking metoprolol 25 mg daily. -Follow up with cardiologist Dr. Jama at [facility]. -Consider cardiac rehabilitation to improve heart function and reduce recurrence risk. -Monitor your blood pressure and heart rate regularly.  RECURRENT ACUTE PANCREATITIS: You have had multiple episodes of pancreatitis, but the exact cause is still unknown. -Follow up with gastroenterology after your FibroScan on November 23rd.  FATTY LIVER DISEASE: You have fatty liver disease, and we need to assess the condition of your liver. -Complete your FibroScan on November 23rd. -Follow up with gastroenterology for liver health management.  PROTEIN-CALORIE MALNUTRITION: You are underweight and have nutritional deficiencies due to gastrointestinal issues. -Follow up with a dietitian on Wednesday. -Monitor your nutritional intake and weight.  OSTEOPOROSIS WITH RECURRENT FRAGILITY FRACTURE, LEFT TIBIA: You have osteoporosis and recently had a fracture in your left tibia, which is now healing. -Start physical therapy for rehabilitation after your tibia fracture. -Consider strategies to manage your osteoporosis.  SEIZURE DISORDER: You have a seizure disorder and are currently on medication for it. -Continue taking your epilepsy medications as prescribed.  DEPRESSION: You are experiencing depression were taking Vraylar. -Contact when wanting to restart taking Vraylar. -Consider seeking mental health  support when you feel ready.  ALCOHOL USE DISORDER, SEVERE: You have a history of severe alcohol use disorder and are currently maintaining a significant reduction in alcohol intake. -Continue abstaining from alcohol. -Monitor for any signs of relapse.  PRURITUS WITH OPIOID-INDUCED DERMATITIS: You have severe itching caused by opioid use, specifically Dilaudid . -Refer to dermatology for further evaluation and management. -Continue taking your current antihistamines to control itching.  PREAURICULAR LYMPHADENOPATHY, BILATERAL: You have small, non-tender nodules on both ears that may be reactive lymph nodes. -Include this in your dermatology referral for evaluation.  ANEMIA, UNSPECIFIED: You have anemia, which is affecting your overall health. -Address nutritional deficiencies.

## 2023-12-24 NOTE — Telephone Encounter (Signed)
 Patient seen on 12/20/23.  JM

## 2024-01-17 ENCOUNTER — Telehealth: Payer: Self-pay

## 2024-01-17 NOTE — Transitions of Care (Post Inpatient/ED Visit) (Unsigned)
   01/17/2024  Name: Crystal Mckinney MRN: [id number] DOB: 1975/06/01  Today's TOC FU Call Status: Today's TOC FU Call Status:: Unsuccessful Call (1st Attempt) Unsuccessful Call (1st Attempt) Date: 01/17/24  Attempted to reach the patient regarding the most recent Inpatient/ED visit.  Follow Up Plan: Additional outreach attempts will be made to reach the patient to complete the Transitions of Care (Post Inpatient/ED visit) call.   Signature Julian Lemmings, LPN [facility] Nurse Health Advisor Direct Dial [phone]

## 2024-01-18 NOTE — Transitions of Care (Post Inpatient/ED Visit) (Unsigned)
   01/18/2024  Name: Crystal Mckinney MRN: [id number] DOB: Jan 17, 1976  Today's TOC FU Call Status: Today's TOC FU Call Status:: Unsuccessful Call (2nd Attempt) Unsuccessful Call (1st Attempt) Date: 01/17/24 Unsuccessful Call (2nd Attempt) Date: 01/18/24  Attempted to reach the patient regarding the most recent Inpatient/ED visit.  Follow Up Plan: Additional outreach attempts will be made to reach the patient to complete the Transitions of Care (Post Inpatient/ED visit) call.   Signature Julian Lemmings, LPN [facility] Nurse Health Advisor Direct Dial [phone]

## 2024-01-19 NOTE — Transitions of Care (Post Inpatient/ED Visit) (Signed)
   01/19/2024  Name: SHARMAYNE JABLON MRN: [id number] DOB: 1975/12/19  Today's TOC FU Call Status: Today's TOC FU Call Status:: Unsuccessful Call (3rd Attempt) Unsuccessful Call (1st Attempt) Date: 01/17/24 Unsuccessful Call (2nd Attempt) Date: 01/18/24 Unsuccessful Call (3rd Attempt) Date: 01/19/24  Attempted to reach the patient regarding the most recent Inpatient/ED visit.  Follow Up Plan: No further outreach attempts will be made at this time. We have been unable to contact the patient.  Signature Julian Lemmings, LPN [facility] Nurse Health Advisor Direct Dial [phone]

## 2024-02-07 ENCOUNTER — Telehealth: Payer: Self-pay

## 2024-02-07 NOTE — Transitions of Care (Post Inpatient/ED Visit) (Signed)
 02/07/2024  Name: Crystal Mckinney MRN: [id number] DOB: 09-19-1975  Today's TOC FU Call Status: Today's TOC FU Call Status:: Successful TOC FU Call Completed TOC FU Call Complete Date: 02/07/24  Patient's Name and Date of Birth confirmed. DOB, Name  Transition Care Management Follow-up Telephone Call Date of Discharge: 02/04/24 Discharge Facility: Other [facility]) Name of Other (Non[facility]) Discharge Facility: [facility] Type of Discharge: Inpatient Admission Primary Inpatient Discharge Diagnosis:: Acute on Chronic Pancreatitis How have you been since you were released from the hospital?: Better Any questions or concerns?: Yes Patient Questions/Concerns:: (S) Patient was unable to get Fiorcet; discharge summary states that she would have a printed rx but she says she never received - Would like to discuss pain management until she is able to be seen by pain clinic Patient Questions/Concerns Addressed: Notified Provider of Patient Questions/Concerns  Items Reviewed: Did you receive and understand the discharge instructions provided?: Yes Medications obtained,verified, and reconciled?: Yes (Medications Reviewed) Any new allergies since your discharge?: No Dietary orders reviewed?: NA Do you have support at home?: No  Medications Reviewed Today: Medications Reviewed Today     Reviewed by Lavelle Charmaine NOVAK, LPN (Licensed Practical Nurse) on 02/07/24 at 1219  Med List Status: <None>   Medication Order Taking? Sig Documenting Provider Last Dose Status Informant  amLODipine (NORVASC) 5 MG tablet [id number] Yes Take 2.5 mg by mouth. [provider]  Active   ARIPiprazole  (ABILIFY ) 2 MG tablet [id number]  TAKE 1 TABLET BY MOUTH EVERY DAY  Patient not taking: Reported on 02/07/2024   Matthews, Jason J, MD  Active   atorvastatin (LIPITOR) 20 MG tablet [id number] Yes Take 20 mg by mouth daily. [provider]  Active   bisacodyl  (DULCOLAX) 10 MG suppository  [id number] Yes Place rectally. [provider]  Active   Buprenorphine HCl-Naloxone HCl 2-0.5 MG FILM [id number] Yes Place under the tongue. [provider]  Active   butalbital-acetaminophen -caffeine (FIORICET) 50-325-40 MG tablet [id number]  Take 1 tablet by mouth.  Patient not taking: Reported on 02/07/2024   [provider]  Active   carbamazepine  (TEGRETOL  XR) 100 MG 12 hr tablet [id number] Yes Take 300 mg by mouth 2 (two) times daily. [provider]  Active     Discontinued 02/07/24 1219 cetirizine (ZYRTEC) 10 MG tablet [id number] Yes Take 10 mg by mouth. [provider]  Active   cloBAZam (ONFI) 10 MG tablet [id number]  Take 10 mg by mouth at bedtime.  Patient not taking: Reported on 12/20/2023   [provider]  Active    Patient not taking:   Discontinued 02/07/24 1219   cyanocobalamin (VITAMIN B12) 1000 MCG/ML injection [id number] Yes Inject 1,000 mcg into the muscle every 30 (thirty) days. [provider]  Active   dicyclomine (BENTYL) 10 MG capsule [id number]  Take 10 mg by mouth 3 (three) times daily.  Patient not taking: Reported on 12/20/2023   [provider]  Active     Discontinued 08/26/19 1601 HYDROmorphone  (DILAUDID ) 2 MG tablet [id number] Yes Take 2 mg by mouth every 4 (four) hours as needed. [provider]  Active     Discontinued 02/07/24 1219   hydrOXYzine (ATARAX) 25 MG tablet [id number] Yes Take 25 mg by mouth every 6 (six) hours as needed. [provider]  Active     Discontinued 08/26/19 1601   Discontinued 08/26/19 1601 levonorgestrel (MIRENA) 20 MCG/24HR IUD [id number] Yes 1 each by Intrauterine route once. [provider]  Active  Self  Lidocaine -Capsaicin  4.75-0.025 % PTCH [id number]  Apply 1 patch topically daily as needed. Apply one patch to the affected area once daily. Leave on for up to 12 hours in a 24-hour period, then remove. Apply to clean, dry, intact skin only. Do not  use more than one patch at a time. Alvia Selinda PARAS, MD  Active   lipase/protease/amylase (CREON ) 36000 UNITS CPEP capsule [id number] Yes Take 72,000 units of lipase by mouth 3 (three) times daily with meals. [provider]  Active   losartan (COZAAR) 25 MG tablet [id number]  Take 25 mg by mouth daily. [provider]  Active   MENATETRENONE PO [id number]  Take 1 tablet by mouth every morning. [provider]  Active   metoprolol succinate (TOPROL-XL) 25 MG 24 hr tablet [id number]  Take 12.5 mg by mouth daily.  Patient not taking: Reported on 02/07/2024   [provider]  Active   Multiple Vitamins-Minerals (MULTIVITAMIN WITH MINERALS) tablet [id number] Yes Take 1 tablet by mouth daily. [provider]  Active Self  naloxone [facility]) nasal spray 4 mg/0.1 mL [id number] Yes Give single spray in one nostril.  Repeat with 2nd device in other nostril every 3 minutes if no or minimal response until 911 arrives. Use as directed. [provider]  Active   naltrexone (DEPADE) 50 MG tablet [id number]  Take 1 tablet (50 mg total) by mouth daily. Start nightly at half dose (25 mg), x 3 days then increase to 50 mg nightly [provider]  Active   nitroGLYCERIN (NITROSTAT) 0.4 MG SL tablet [id number] Yes Place 0.4 mg under the tongue. [provider]  Active     Discontinued 08/26/19 1601   oxyCODONE -acetaminophen  (PERCOCET/ROXICET) 5-325 MG tablet [id number]  Take 1-2 tablets by mouth every 6 (six) hours as needed for severe pain (pain score 7-10).  Patient not taking: Reported on 02/07/2024   Mortenson, Ashley, MD  Active   pantoprazole  (PROTONIX ) 40 MG tablet [id number] Yes TAKE 1 TABLET BY MOUTH TWICE A DAY Matthews, Jason J, MD  Active   polyethylene glycol powder [facility]) 17 GM/SCOOP powder [id number] Yes Take 17 g by mouth. [provider]  Active   pregabalin (LYRICA) 50 MG capsule [id number] Yes Take 50 mg by mouth. [provider]  Active   promethazine (PHENERGAN) 25 MG suppository [id number] Yes Place 25 mg rectally. [provider]  Active   tiZANidine  (ZANAFLEX ) 4 MG tablet [id number]  Take 1 tablet (4 mg total) by mouth every 8 (eight) hours as needed for muscle spasms.  Patient not taking: Reported on 02/07/2024   Alvia Selinda PARAS, MD  Active   Venlafaxine HCl (EFFEXOR XR PO) [id number] Yes Take 75 mg by mouth daily. [provider]  Active Self  Vitamin D , Ergocalciferol , (DRISDOL ) 1.25 MG (50000 UNIT) CAPS capsule [id number]  Take 1 capsule (50,000 Units total) by mouth every 7 (seven) days. Take for 8 total doses(weeks)  Patient not taking: Reported on 02/07/2024   Alvia Selinda PARAS, MD  Active             Home Care and Equipment/Supplies: Were Home Health Services Ordered?: NA Any new equipment or medical supplies ordered?: NA  Functional Questionnaire: Do you need assistance with bathing/showering or dressing?: No Do you need assistance with meal preparation?: No Do you need assistance with eating?: No Do you have difficulty maintaining continence: No Do you need assistance with getting out of bed/getting out of a chair/moving?: No Do you  have difficulty managing or taking your medications?: No  Follow up appointments reviewed: PCP Follow-up appointment confirmed?: Yes Date of PCP follow-up appointment?: 02/09/24 Follow-up Provider: Dr. Alvia Specialist [facility] Follow-up appointment confirmed?: No Reason Specialist Follow-Up Not Confirmed: Patient has Specialist Provider Number and will Call for Appointment Do you need transportation to your follow-up appointment?: No Do you understand care options if your condition(s) worsen?: Yes-patient verbalized understanding    SIGNATURE Charmaine Bloodgood, LPN [facility] Health Advisor [facility] You Are. We Are. One [facility] Direct Dial [phone]

## 2024-02-09 ENCOUNTER — Telehealth: Payer: Self-pay

## 2024-02-09 ENCOUNTER — Inpatient Hospital Stay: Admitting: Family Medicine

## 2024-02-09 ENCOUNTER — Telehealth: Payer: Self-pay | Admitting: Family Medicine

## 2024-02-09 NOTE — Telephone Encounter (Signed)
 Called and left VM for patient to call back or send [facility] message  in regards to prescription request Dr. Alvia can't fill.   JM

## 2024-02-09 NOTE — Telephone Encounter (Signed)
 Copied from CRM [id number]. Topic: Clinical - Medication Question >> Feb 09, 2024  9:53 AM Shanda MATSU wrote: Reason for CRM: Patient is wanting to know if there is any way possible that prescription can be sent for med, Suboxone. Patient stated she took her last dose this morning and thought she would be able to get a prescription sent on today at her appt, but she has a flat tire and cannot make it to her appt so now she is out of the med. Patient stated that she cannot just stop the med and is wanting to know if a prescription can be sent just for her evening dose and tomorrow morning's dose as she rescheduled her appt for tomorrow morning. Patient stated she takes 2mg  at 8am and 2mg  at 8pm, patient also stated that is a film that she takes not a pill, patient is req a call back in regards to this.

## 2024-02-10 ENCOUNTER — Ambulatory Visit: Admitting: Family Medicine

## 2024-02-10 ENCOUNTER — Encounter: Payer: Self-pay | Admitting: Family Medicine

## 2024-02-10 VITALS — BP 90/50 | HR 102 | Ht 65.0 in | Wt 117.0 lb

## 2024-02-10 DIAGNOSIS — I214 Non-ST elevation (NSTEMI) myocardial infarction: Secondary | ICD-10-CM | POA: Diagnosis not present

## 2024-02-10 DIAGNOSIS — K900 Celiac disease: Secondary | ICD-10-CM

## 2024-02-10 DIAGNOSIS — S82892D Other fracture of left lower leg, subsequent encounter for closed fracture with routine healing: Secondary | ICD-10-CM

## 2024-02-10 DIAGNOSIS — G43E09 Chronic migraine with aura, not intractable, without status migrainosus: Secondary | ICD-10-CM | POA: Diagnosis not present

## 2024-02-10 DIAGNOSIS — S82892A Other fracture of left lower leg, initial encounter for closed fracture: Secondary | ICD-10-CM | POA: Insufficient documentation

## 2024-02-10 DIAGNOSIS — K219 Gastro-esophageal reflux disease without esophagitis: Secondary | ICD-10-CM

## 2024-02-10 DIAGNOSIS — I728 Aneurysm of other specified arteries: Secondary | ICD-10-CM | POA: Insufficient documentation

## 2024-02-10 DIAGNOSIS — G8929 Other chronic pain: Secondary | ICD-10-CM

## 2024-02-10 DIAGNOSIS — F341 Dysthymic disorder: Secondary | ICD-10-CM

## 2024-02-10 DIAGNOSIS — M8080XD Other osteoporosis with current pathological fracture, unspecified site, subsequent encounter for fracture with routine healing: Secondary | ICD-10-CM

## 2024-02-10 MED ORDER — HYDROXYZINE HCL 25 MG PO TABS: 25.0000 mg | ORAL_TABLET | Freq: Four times a day (QID) | ORAL | 1 refills | Status: AC | PRN

## 2024-02-10 NOTE — Progress Notes (Signed)
 Primary Care / Sports Medicine Office Visit  Patient Information:  Patient ID: Crystal Mckinney, female DOB: December 03, 1975 Age: 48 y.o. MRN: [id number]   Crystal Mckinney is a pleasant 48 y.o. female presenting with the following:  Chief Complaint  Patient presents with   Hospitalization Follow-up    Patient presents today for a hospital follow up. She was in the hospital for 2 months as she had a heart attack Oct 2. She is doing better today and needs refills on some medications.     Vitals:   02/10/24 1043  BP: (!) 90/50  Pulse: (!) 102  SpO2: 93%   Vitals:   02/10/24 1043  Weight: 117 lb (53.1 kg)  Height: 5' 5 (1.651 m)   Body mass index is 19.47 kg/m.  No results found.   Discussed the use of AI scribe software for clinical note transcription with the patient, who gave verbal consent to proceed.   Independent interpretation of notes and tests performed by another provider:   None  Procedures performed:   None  Pertinent History, Exam, Impression, and Recommendations:   History of Present Illness Crystal Mckinney is a 48 year old female with pancreatitis and recent NSTEMI who presents with ongoing abdominal pain and medication management issues.  Abdominal pain and pancreatitis - Recurrent, severe abdominal pain radiating to the back and kidneys, described as feeling 'hit by a Mack truck'. - Multiple hospitalizations: October 1st, 2025 (9 days), readmission 2 days post-discharge, and another admission on November 9th, 2025 (over 2 weeks). - Episodes have increased in frequency and duration. - Pain management remains a significant challenge. - Abstinent from alcohol for months except for a few glasses of wine at a summer wedding, which preceded a pancreatitis episode. - Consistent diet due to celiac disease; no dietary triggers identified. - Feeding tube required for 10 days during last hospitalization due to malnutrition.  Cardiac symptoms and blood pressure  variability - NSTEMI occurred on October 2nd, 2025, during hospitalization for pancreatitis. - Discharged from cardiac care on October 9th, 2025. - Recent heart MRI performed; no current issues with cardiac medications. - Blood pressure is erratic, ranging from 90/60 to 175/108. - Monitors blood pressure three times daily.  Pain management and medication issues - Suboxone prescribed for pain management during last hospital stay; currently out of medication. - Allergic reaction to IV Dilaudid , resulting in itching and bleeding; managed with antihistamines.  Migraine headaches - Migraine headaches have recurred after a decade of remission. - Four episodes in the past five to eight weeks. - Requires Fioricet for migraine management.  Lower extremity symptoms and mobility - History of left tibia fracture; cast removed October 9th, 2025, followed by two weeks in a boot. - Stiffness and swelling in the affected leg, worsened by limited mobility during hospitalizations. - Edema present in the left leg, attributed to recent injury and immobility.  Gastrointestinal and nutritional issues - Long-standing gastroenterology care for GI issues, including recent surgery for an aneurysm. - Celiac disease with adherence to a consistent diet. - Feeding tube placement for 10 days during last hospitalization due to malnutrition.  Physical Exam INSPECTION: Left leg stiffness and swelling present. EXTREMITIES: Right leg without edema, left leg with edema. SKIN: Persistent bleeding from skin lesions.  Assessment and Plan Chronic pancreatitis with recurrent abdominal pain Recurrent severe abdominal pain with no clear triggers. Previous alcohol association, current episodes not linked. Pain management inadequate, causing frequent hospitalizations. - Contact [facility] for Suboxone management. - Consider ER visit if unable to secure Suboxone management through [facility]. - Continue current pain  management regimen until Suboxone is secured.  NSTEMI (Non-ST elevation myocardial infarction), post-hospitalization Recent NSTEMI with fluctuating blood pressure, possibly due to pain and medication effects. - Continue current cardiac medications as prescribed by cardiologist.  Epilepsy, stable on current regimen  Migraine, recurrent Recurrent migraines with four episodes in the last 5-7 weeks. Fioricet prescribed but not received. - Contact [facility] for Fioricet management. - Consider ER visit if unable to secure Fioricet management through [facility].  Osteoporosis, candidate for antiresorptive therapy Candidate for antiresorptive therapy with biannual injections. - Referred to rheumatology or orthopedics for osteoporosis management and potential biannual injections.  Left tibia fracture, post-immobilization with persistent edema and stiffness Persistent edema and stiffness post-immobilization, likely due to prolonged immobilization and possible nutritional deficiencies. - Referred to home health physical therapy for rehabilitation of left tibia.  Pruritus secondary to IV Dilaudid  allergy Pruritus due to IV Dilaudid  allergy, managed with hydroxyzine. - Prescribed hydroxyzine for pruritus management.  Depression, ongoing therapy Ongoing therapy for depression with low energy and physical aches affecting mental health. - Referred to psychiatry for evaluation and potential medication management.  Malnutrition, post-hospitalization Malnutrition post-hospitalization affecting recovery and overall health. - Ensure adequate nutritional intake to support recovery.  Gastroesophageal reflux disease (GERD) and diarrhea, under gastroenterology care GERD and diarrhea managed by gastroenterology. - Continue regular follow-ups with gastroenterology.  Problem List Items Addressed This Visit     Celiac disease   Chronic migraine with aura without status migrainosus, not  intractable   Closed fracture of left ankle   Relevant Orders   Ambulatory referral to Home Health   GERD (gastroesophageal reflux disease)   NSTEMI (non-ST elevated myocardial infarction) (HCC) - Primary   Osteoporosis with current pathological fracture   Relevant Medications   Cholecalciferol (VITAMIN D -3) 25 MCG (1000 UT) CAPS   Other Relevant Orders   Ambulatory referral to Orthopedic Surgery   Other chronic pain   Relevant Medications   hydrOXYzine (ATARAX) 25 MG tablet   Persistent depressive disorder   Relevant Medications   hydrOXYzine (ATARAX) 25 MG tablet   Other Relevant Orders   Ambulatory referral to Psychiatry   Pseudoaneurysm of splenic artery     Orders & Medications Medications:  Meds ordered this encounter  Medications   hydrOXYzine (ATARAX) 25 MG tablet    Sig: Take 1 tablet (25 mg total) by mouth every 6 (six) hours as needed.    Dispense:  30 tablet    Refill:  1   Orders Placed This Encounter  Procedures   Ambulatory referral to Psychiatry   Ambulatory referral to Orthopedic Surgery   Ambulatory referral to Home Health     No follow-ups on file.     Crystal JINNY Ku, MD, [facility]   Primary Care Sports Medicine Primary Care and Sports Medicine at [facility]

## 2024-02-10 NOTE — Patient Instructions (Signed)
 VISIT SUMMARY:  During your visit, we discussed your ongoing abdominal pain related to chronic pancreatitis, recent NSTEMI, and other health concerns including migraines, osteoporosis, and post-immobilization issues with your left leg. We reviewed your current medications and made plans to address your pain management and other symptoms.  YOUR PLAN:  CHRONIC PANCREATITIS WITH RECURRENT ABDOMINAL PAIN: You have severe abdominal pain that has been difficult to manage and has led to multiple hospitalizations. -We will contact the [facility] to manage your Suboxone prescription. -If you cannot get Suboxone from [facility], consider going to the ER. -Continue your current pain management regimen until you get Suboxone.  NSTEMI (NON-ST ELEVATION MYOCARDIAL INFARCTION), POST-HOSPITALIZATION: You had a recent NSTEMI and your blood pressure has been fluctuating. -Continue taking your current cardiac medications as prescribed by your cardiologist.  MIGRAINE, RECURRENT: You have had recurrent migraines recently. -We will contact the [facility] to manage your Fioricet prescription. -If you cannot get Fioricet from [facility], consider going to the ER.  OSTEOPOROSIS, CANDIDATE FOR ANTIRESORPTIVE THERAPY: You are a candidate for antiresorptive therapy to strengthen your bones. -You are referred to rheumatology or orthopedics for osteoporosis management and potential biannual injections.  LEFT TIBIA FRACTURE, POST-IMMOBILIZATION WITH PERSISTENT EDEMA AND STIFFNESS: You have persistent swelling and stiffness in your left leg after your cast was removed. -You are referred to home health physical therapy for rehabilitation of your left tibia.  PRURITUS SECONDARY TO IV DILAUDID  ALLERGY: You have itching due to an allergy to IV Dilaudid . -You are prescribed hydroxyzine to manage the itching.  DEPRESSION, ONGOING THERAPY: You are experiencing low energy and physical aches affecting your  mental health. -You are referred to psychiatry for evaluation and potential medication management.  MALNUTRITION, POST-HOSPITALIZATION: You have been experiencing malnutrition which is affecting your recovery. -Ensure you are getting adequate nutrition to support your recovery.  GASTROESOPHAGEAL REFLUX DISEASE (GERD) AND DIARRHEA, UNDER GASTROENTEROLOGY CARE: You have GERD and diarrhea that are being managed by your gastroenterologist. -Continue regular follow-ups with your gastroenterologist.

## 2024-02-11 ENCOUNTER — Telehealth: Payer: Self-pay | Admitting: Family Medicine

## 2024-02-11 ENCOUNTER — Other Ambulatory Visit: Payer: Self-pay

## 2024-02-11 DIAGNOSIS — G8929 Other chronic pain: Secondary | ICD-10-CM

## 2024-02-11 NOTE — Telephone Encounter (Signed)
 Copied from CRM #[id number]. Topic: Referral - Request for Referral >> Feb 11, 2024 11:34 AM Winona SAUNDERS wrote: Did the patient discuss referral with their provider in the last year? Yes (If No - schedule appointment) (If Yes - send message)  Appointment offered? No  Type of order/referral and detailed reason for visit: Pain Clinic  Preference of office, provider, location: [facility] at [facility] Pain clinic needs  Fax number [phone]  If referral order, have you been seen by this specialty before? No  (If Yes, this issue or another issue? When? Where?  Can we respond through MyChart? Yes

## 2024-02-11 NOTE — Telephone Encounter (Signed)
 Copied from CRM #[id number]. Topic: Referral - Status >> Feb 11, 2024  9:44 AM China J wrote: Reason for CRM: Jazmine calling from [facility] to let Dr. Alvia know that they will have to deny this referral at this time due to short staffing.

## 2024-02-11 NOTE — Telephone Encounter (Signed)
 Please review Dr. Alvia message above. Thank you.  JM

## 2024-02-14 ENCOUNTER — Inpatient Hospital Stay: Admitting: Family Medicine

## 2024-02-27 ENCOUNTER — Observation Stay: Payer: Self-pay

## 2024-02-27 ENCOUNTER — Emergency Department: Payer: Self-pay

## 2024-02-27 ENCOUNTER — Observation Stay
Admission: EM | Admit: 2024-02-27 | Discharge: 2024-02-28 | Disposition: A | Discharge status: Home / Self Care | Payer: Self-pay | Attending: Student | Admitting: Student

## 2024-02-27 ENCOUNTER — Other Ambulatory Visit: Payer: Self-pay

## 2024-02-27 DIAGNOSIS — Z681 Body mass index (BMI) 19 or less, adult: Secondary | ICD-10-CM | POA: Insufficient documentation

## 2024-02-27 DIAGNOSIS — K219 Gastro-esophageal reflux disease without esophagitis: Secondary | ICD-10-CM | POA: Diagnosis present

## 2024-02-27 DIAGNOSIS — I951 Orthostatic hypotension: Secondary | ICD-10-CM

## 2024-02-27 DIAGNOSIS — I10 Essential (primary) hypertension: Secondary | ICD-10-CM | POA: Insufficient documentation

## 2024-02-27 DIAGNOSIS — I959 Hypotension, unspecified: Secondary | ICD-10-CM | POA: Diagnosis present

## 2024-02-27 DIAGNOSIS — F419 Anxiety disorder, unspecified: Secondary | ICD-10-CM | POA: Diagnosis present

## 2024-02-27 DIAGNOSIS — K589 Irritable bowel syndrome without diarrhea: Secondary | ICD-10-CM | POA: Diagnosis present

## 2024-02-27 DIAGNOSIS — N179 Acute kidney failure, unspecified: Secondary | ICD-10-CM | POA: Insufficient documentation

## 2024-02-27 DIAGNOSIS — S0101XA Laceration without foreign body of scalp, initial encounter: Secondary | ICD-10-CM

## 2024-02-27 DIAGNOSIS — Z8669 Personal history of other diseases of the nervous system and sense organs: Secondary | ICD-10-CM | POA: Insufficient documentation

## 2024-02-27 DIAGNOSIS — Z79899 Other long term (current) drug therapy: Secondary | ICD-10-CM | POA: Insufficient documentation

## 2024-02-27 DIAGNOSIS — E872 Acidosis, unspecified: Secondary | ICD-10-CM | POA: Insufficient documentation

## 2024-02-27 DIAGNOSIS — R55 Syncope and collapse: Principal | ICD-10-CM | POA: Insufficient documentation

## 2024-02-27 DIAGNOSIS — R569 Unspecified convulsions: Secondary | ICD-10-CM

## 2024-02-27 LAB — COMPREHENSIVE METABOLIC PANEL WITH GFR
ALT: 9 U/L (ref 0–44)
AST: 32 U/L (ref 15–41)
Albumin: 3.8 g/dL (ref 3.5–5.0)
Alkaline Phosphatase: 203 U/L — ABNORMAL HIGH (ref 38–126)
Anion gap: 17 — ABNORMAL HIGH (ref 5–15)
BUN: 14 mg/dL (ref 6–20)
CO2: 19 mmol/L — ABNORMAL LOW (ref 22–32)
Calcium: 8.2 mg/dL — ABNORMAL LOW (ref 8.9–10.3)
Chloride: 92 mmol/L — ABNORMAL LOW (ref 98–111)
Creatinine, Ser: 1.91 mg/dL — ABNORMAL HIGH (ref 0.44–1.00)
GFR, Estimated: 32 mL/min — ABNORMAL LOW
Glucose, Bld: 118 mg/dL — ABNORMAL HIGH (ref 70–99)
Potassium: 4.1 mmol/L (ref 3.5–5.1)
Sodium: 128 mmol/L — ABNORMAL LOW (ref 135–145)
Total Bilirubin: 0.3 mg/dL (ref 0.0–1.2)
Total Protein: 6.9 g/dL (ref 6.5–8.1)

## 2024-02-27 LAB — CBC WITH DIFFERENTIAL/PLATELET
Abs Immature Granulocytes: 0.04 K/uL (ref 0.00–0.07)
Basophils Absolute: 0.1 K/uL (ref 0.0–0.1)
Basophils Relative: 1 %
Eosinophils Absolute: 0 K/uL (ref 0.0–0.5)
Eosinophils Relative: 0 %
HCT: 25.6 % — ABNORMAL LOW (ref 36.0–46.0)
Hemoglobin: 8.1 g/dL — ABNORMAL LOW (ref 12.0–15.0)
Immature Granulocytes: 1 %
Lymphocytes Relative: 34 %
Lymphs Abs: 2.8 K/uL (ref 0.7–4.0)
MCH: 31 pg (ref 26.0–34.0)
MCHC: 31.6 g/dL (ref 30.0–36.0)
MCV: 98.1 fL (ref 80.0–100.0)
Monocytes Absolute: 1.4 K/uL — ABNORMAL HIGH (ref 0.1–1.0)
Monocytes Relative: 17 %
Neutro Abs: 3.9 K/uL (ref 1.7–7.7)
Neutrophils Relative %: 47 %
Platelets: 231 K/uL (ref 150–400)
RBC: 2.61 MIL/uL — ABNORMAL LOW (ref 3.87–5.11)
RDW: 12.8 % (ref 11.5–15.5)
WBC: 8.1 K/uL (ref 4.0–10.5)
nRBC: 0 % (ref 0.0–0.2)

## 2024-02-27 LAB — TROPONIN T, HIGH SENSITIVITY
Troponin T High Sensitivity: 33 ng/L — ABNORMAL HIGH (ref 0–19)
Troponin T High Sensitivity: 26 ng/L — ABNORMAL HIGH (ref 0–19)

## 2024-02-27 LAB — HIV ANTIBODY (ROUTINE TESTING W REFLEX): HIV Screen 4th Generation wRfx: NONREACTIVE

## 2024-02-27 LAB — MAGNESIUM: Magnesium: 1.8 mg/dL (ref 1.7–2.4)

## 2024-02-27 MED ORDER — PROCHLORPERAZINE EDISYLATE 10 MG/2ML IJ SOLN
10.0000 mg | Freq: Four times a day (QID) | INTRAMUSCULAR | Status: DC | PRN
  Administered 2024-02-27: 10 mg via INTRAVENOUS
  Filled 2024-02-27: qty 2

## 2024-02-27 MED ORDER — HYDROMORPHONE HCL 1 MG/ML IJ SOLN
0.5000 mg | INTRAMUSCULAR | Status: DC | PRN
  Administered 2024-02-27 – 2024-02-28 (×5): 0.5 mg via INTRAVENOUS
  Filled 2024-02-27 (×5): qty 0.5

## 2024-02-27 MED ORDER — LACTATED RINGERS IV BOLUS
1000.0000 mL | Freq: Once | INTRAVENOUS | Status: AC
  Administered 2024-02-27: 1000 mL via INTRAVENOUS

## 2024-02-27 MED ORDER — HYDROMORPHONE HCL 1 MG/ML IJ SOLN
1.0000 mg | Freq: Once | INTRAMUSCULAR | Status: AC
  Administered 2024-02-27: 1 mg via INTRAVENOUS
  Filled 2024-02-27: qty 1

## 2024-02-27 MED ORDER — ACETAMINOPHEN 650 MG RE SUPP: 650.0000 mg | Freq: Four times a day (QID) | RECTAL | Status: DC | PRN

## 2024-02-27 MED ORDER — CARBAMAZEPINE ER 200 MG PO TB12
300.0000 mg | ORAL_TABLET | Freq: Two times a day (BID) | ORAL | Status: DC
  Filled 2024-02-27: qty 1

## 2024-02-27 MED ORDER — PANTOPRAZOLE SODIUM 40 MG IV SOLR
40.0000 mg | Freq: Two times a day (BID) | INTRAVENOUS | Status: DC
  Administered 2024-02-27 (×2): 40 mg via INTRAVENOUS
  Filled 2024-02-27 (×2): qty 10

## 2024-02-27 MED ORDER — SODIUM CHLORIDE 0.9 % IV SOLN: INTRAVENOUS | Status: DC

## 2024-02-27 MED ORDER — CARBAMAZEPINE ER 200 MG PO TB12
500.0000 mg | ORAL_TABLET | Freq: Two times a day (BID) | ORAL | Status: DC
  Administered 2024-02-27 (×2): 500 mg via ORAL
  Filled 2024-02-27 (×3): qty 1

## 2024-02-27 MED ORDER — TETANUS-DIPHTH-ACELL PERTUSSIS 5-2-15.5 LF-MCG/0.5 IM SUSP
0.5000 mL | Freq: Once | INTRAMUSCULAR | Status: AC
  Administered 2024-02-27: 0.5 mL via INTRAMUSCULAR
  Filled 2024-02-27: qty 0.5

## 2024-02-27 MED ORDER — ATORVASTATIN CALCIUM 20 MG PO TABS: 20.0000 mg | ORAL_TABLET | Freq: Every day | ORAL | Status: DC

## 2024-02-27 MED ORDER — LIDOCAINE-EPINEPHRINE 2 %-1:100000 IJ SOLN
20.0000 mL | Freq: Once | INTRAMUSCULAR | Status: DC
  Filled 2024-02-27: qty 1

## 2024-02-27 MED ORDER — FENTANYL CITRATE (PF) 50 MCG/ML IJ SOSY
12.5000 ug | PREFILLED_SYRINGE | INTRAMUSCULAR | Status: DC | PRN
  Administered 2024-02-27: 50 ug via INTRAVENOUS
  Filled 2024-02-27: qty 1

## 2024-02-27 MED ORDER — ACETAMINOPHEN 325 MG PO TABS
650.0000 mg | ORAL_TABLET | Freq: Four times a day (QID) | ORAL | Status: DC | PRN
  Administered 2024-02-27: 650 mg via ORAL
  Filled 2024-02-27: qty 2

## 2024-02-27 NOTE — ED Provider Notes (Signed)
 "  [facility] Provider Note    Event Date/Time   First MD Initiated Contact with Patient 02/27/24 [phone]     (approximate)   History   Chief Complaint Fall   HPI  Crystal Mckinney is a 48 y.o. female with past medical history of hypertension, CAD, pancreatitis, anemia, alcohol abuse, and seizures who presents to the ED complaining of fall.  Patient reports that she had been feeling dizzy when she went to bed last night, checked her blood pressure and found it to be low.  She then attempted to walk to the bathroom overnight, became dizzy and lost consciousness, falling backwards and striking her head.  She believes she hit her head on the hardwood floor and noticed a significant amount of bleeding from the back of her head.  She complains of headache and took some oral Dilaudid  at home prior to arrival, also complains of some neck pain, but denies any pain in her trunk or extremities.  She denies any chest pain or shortness of breath with this episode, does admit that she had not eaten or drank much in the past 2 days due to vomiting and diarrhea.     Physical Exam   Triage Vital Signs: ED Triage Vitals  Encounter Vitals Group     BP 02/27/24 0526 (!) 85/73     Girls Systolic BP Percentile --      Girls Diastolic BP Percentile --      Boys Systolic BP Percentile --      Boys Diastolic BP Percentile --      Pulse Rate 02/27/24 0526 93     Resp 02/27/24 0526 16     Temp 02/27/24 0526 97.9 F (36.6 C)     Temp Source 02/27/24 0526 Oral     SpO2 02/27/24 0526 100 %     Weight 02/27/24 0528 115 lb (52.2 kg)     Height 02/27/24 0528 5' 5 (1.651 m)     Head Circumference --      Peak Flow --      Pain Score 02/27/24 0527 8     Pain Loc --      Pain Education --      Exclude from Growth Chart --     Most recent vital signs: Vitals:   02/27/24 0700 02/27/24 0718  BP:    Pulse: 81 92  Resp: 16 17  Temp:    SpO2: 100% 99%    Constitutional: Alert and  oriented. Eyes: Conjunctivae are normal. Head: Posterior scalp laceration without hematoma or step-off. Nose: No congestion/rhinnorhea. Mouth/Throat: Mucous membranes are moist.  Neck: Midline cervical spine tenderness to palpation noted. Cardiovascular: Normal rate, regular rhythm. Grossly normal heart sounds.  2+ radial pulses bilaterally. Respiratory: Normal respiratory effort.  No retractions. Lungs CTAB.  No chest wall tenderness to palpation. Gastrointestinal: Soft and nontender. No distention. Musculoskeletal: No lower extremity tenderness nor edema.  No upper extremity bony tenderness to palpation. Neurologic:  Normal speech and language. No gross focal neurologic deficits are appreciated.    ED Results / Procedures / Treatments   Labs (all labs ordered are listed, but only abnormal results are displayed) Labs Reviewed  CBC WITH DIFFERENTIAL/PLATELET - Abnormal; Notable for the following components:      Result Value   RBC 2.61 (*)    Hemoglobin 8.1 (*)    HCT 25.6 (*)    Monocytes Absolute 1.4 (*)    All other components within normal limits  COMPREHENSIVE METABOLIC PANEL WITH GFR - Abnormal; Notable for the following components:   Sodium 128 (*)    Chloride 92 (*)    CO2 19 (*)    Glucose, Bld 118 (*)    Creatinine, Ser 1.91 (*)    Calcium  8.2 (*)    Alkaline Phosphatase 203 (*)    GFR, Estimated 32 (*)    Anion gap 17 (*)    All other components within normal limits  TROPONIN T, HIGH SENSITIVITY - Abnormal; Notable for the following components:   Troponin T High Sensitivity 33 (*)    All other components within normal limits  MAGNESIUM   TROPONIN T, HIGH SENSITIVITY     EKG  ED ECG REPORT I, Carlin Palin, the attending physician, personally viewed and interpreted this ECG.   Date: 02/27/2024  EKG Time: 5:30  Rate: 91  Rhythm: normal sinus rhythm  Axis: Normal  Intervals:Prolonged QT  ST&T Change: None  RADIOLOGY CT head reviewed and interpreted  by me with no hemorrhage or midline shift.  PROCEDURES:  Critical Care performed: No  .Laceration Repair  Date/Time: 02/27/2024 7:50 AM  Performed by: Palin Carlin, MD Authorized by: Palin Carlin, MD   Consent:    Consent obtained:  Verbal   Consent given by:  Patient   Risks, benefits, and alternatives were discussed: yes   Universal protocol:    Patient identity confirmed:  Verbally with patient and arm band Anesthesia:    Anesthesia method:  Local infiltration   Local anesthetic:  Lidocaine  2% WITH epi Laceration details:    Location:  Scalp   Scalp location:  L parietal   Length (cm):  6 Pre-procedure details:    Preparation:  Patient was prepped and draped in usual sterile fashion and imaging obtained to evaluate for foreign bodies Exploration:    Limited defect created (wound extended): no     Hemostasis achieved with:  Direct pressure and epinephrine    Imaging obtained comment:  CT   Imaging outcome: foreign body not noted     Wound exploration: wound explored through full range of motion and entire depth of wound visualized     Wound extent: areolar tissue not violated, fascia not violated, no foreign body, no signs of injury, no nerve damage, no tendon damage, no underlying fracture and no vascular damage     Contaminated: no   Treatment:    Area cleansed with:  Saline   Amount of cleaning:  Standard   Irrigation method:  Pressure wash   Debridement:  None   Undermining:  None   Scar revision: no   Approximation:    Approximation:  Loose Repair type:    Repair type:  Simple Post-procedure details:    Dressing:  Open (no dressing)   Procedure completion:  Tolerated well, no immediate complications    MEDICATIONS ORDERED IN ED: Medications  lidocaine -EPINEPHrine  (XYLOCAINE  W/EPI) 2 %-1:100000 (with pres) injection 20 mL (has no administration in time range)  lactated ringers  bolus 1,000 mL (has no administration in time range)  HYDROmorphone   (DILAUDID ) injection 1 mg (has no administration in time range)  lactated ringers  bolus 1,000 mL (1,000 mLs Intravenous New Bag/Given 02/27/24 0610)  Tdap (ADACEL ) injection 0.5 mL (0.5 mLs Intramuscular Given 02/27/24 0612)     IMPRESSION / MDM / ASSESSMENT AND PLAN / ED COURSE  I reviewed the triage vital signs and the nursing notes.  48 y.o. female with past medical history of hypertension, CAD, pancreatitis, anemia, alcohol abuse, and seizures who presents to the ED complaining of syncopal episode where she fell back and struck her head.  Patient's presentation is most consistent with acute presentation with potential threat to life or bodily function.  Differential diagnosis includes, but is not limited to, arrhythmia, ACS, orthostatic hypotension, vasovagal episode, hypovolemia, anemia, electrolyte abnormality, AKI, intracranial injury.  Patient nontoxic-appearing and in no acute distress, vital signs remarkable for borderline hypotension but otherwise reassuring.  EKG shows no evidence of arrhythmia or ischemia and suspect hypovolemia and hypotension to be the cause of her syncopal episode.  CT head and cervical spine were negative for acute process, laceration to her posterior scalp was repaired with staples.  Labs significant for AKI with acute on chronic hyponatremia, LFTs unremarkable.  Anemia slightly improved compared to labs from [facility] last month.  Case discussed with hospitalist for admission.      FINAL CLINICAL IMPRESSION(S) / ED DIAGNOSES   Final diagnoses:  Syncope, unspecified syncope type  AKI (acute kidney injury)  Laceration of scalp, initial encounter     Rx / DC Orders   ED Discharge Orders     None        Note:  This document was prepared using Dragon voice recognition software and may include unintentional dictation errors.   Willo Dunnings, MD 02/27/24 [phone]  "

## 2024-02-27 NOTE — ED Triage Notes (Addendum)
 BIB EMS from home. No blood thinners. Dizzy walking to bathroom with unwitnessed fall. Headache with small laceration in back of head. Took daulid 2mg  and hydrazine 25mg  post fall.  EMS vitals 83/45 with 500 NS HR 90 99 O2 on RM CBG 166 and 98.2 Temp.  Patient had Nstemi in Oct.  IV 20 in r bicep .

## 2024-02-27 NOTE — H&P (Signed)
 "  History and Physical    Crystal Mckinney FMW:[id number] DOB: 1975-12-25 DOA: 02/27/2024  DOS: the patient was seen and examined on 02/27/2024  PCP: Alvia Selinda PARAS, MD   Patient coming from: Home  I have personally briefly reviewed patient's old medical records in [facility]  Chief Complaint: Fall at home  HPI: Crystal Mckinney is a pleasant 48 y.o. female with medical history significant for irritable bowel syndrome/celiac disease, anxiety/attention deficit disorder, CAD, alcohol use disorder with recent history of pancreatitis in November 2025, history of seizure, GERD, chronic anemia who came into ED complaining of unwitnessed fall after she was dizzy while going to the bathroom early this morning.  Patient has a headache with a small occipital laceration.  Patient stated that she was going to the bathroom in the middle of the night early this morning when she felt dizzy and lost consciousness, falling backward and striking her head.  She believes she hit her head on the hardwood floor and noticed a significant amount of bleeding from the back of her head.  She complained headache and took some oral Dilaudid  at home prior to arrival, also complains some of the neck pain and denies any pain in her trunk or extremities.  She believes that she lost consciousness maybe for a minute or so not more than that.  She had watery diarrhea 4 days ago and she was not able to eat and drink much that time.  She also stated that due to her celiac disease and IBS she keeps getting those kind of incident often.  She also complains of right chest pain.  She denies any fever, cough, shortness of breath, nausea vomiting abdominal pain, palpitations, chest pain.  ED Course: Upon arrival to the ED, patient is found to be hypotensive at 85/73, tachycardic at 93, hemoglobin 8.1 hematocrit 25.6, sodium 128, creatinine 1.91, troponin 33 EKG showed sinus rhythm, CT head no acute finding.  Patient received IV fluid 1 L,  sutured the laceration, received Tdap, pain medication hydromorphone .  Hospitalist service was consulted for evaluation for admission for syncope/fall.  Review of Systems:  ROS  All other systems negative except as noted in the HPI.  Past Medical History:  Diagnosis Date   ADD (attention deficit disorder)    Anxiety    Cancer (HCC)    Celiac disease    GERD (gastroesophageal reflux disease)    IBS (irritable bowel syndrome)    Malnutrition    Seizures (HCC)     Past Surgical History:  Procedure Laterality Date   ABDOMINAL SURGERY     HEMATOMA EVACUATION     HIP SURGERY Left      reports that she has never smoked. She has never used smokeless tobacco. She reports that she does not currently use alcohol. She reports that she does not use drugs.  Allergies[1]  Family History  Problem Relation Age of Onset   Healthy Mother    Hyperlipidemia Father     Prior to Admission medications  Medication Sig Start Date End Date Taking? Authorizing Provider  amLODipine  (NORVASC ) 5 MG tablet Take 2.5 mg by mouth. 02/04/24   [provider]  Ascorbic Acid (VITAMIN C) 100 MG tablet Take 100 mg by mouth daily.    [provider]  atorvastatin  (LIPITOR) 20 MG tablet Take 20 mg by mouth daily. 12/24/23 01/27/25  [provider]  bisacodyl  (DULCOLAX) 10 MG suppository Place rectally. 07/03/22   [provider]  butalbital-acetaminophen -caffeine (FIORICET)  50-325-40 MG tablet Take 1 tablet by mouth. 01/25/24   [provider]  carbamazepine  (TEGRETOL  XR) 100 MG 12 hr tablet Take 300 mg by mouth 2 (two) times daily. 01/25/24 01/24/25  [provider]  cetirizine (ZYRTEC) 10 MG tablet Take 10 mg by mouth. 12/13/23   [provider]  Cholecalciferol (VITAMIN D -3) 25 MCG (1000 UT) CAPS Take by mouth.    [provider]  cloBAZam  (ONFI ) 10 MG tablet Take 10 mg by mouth at bedtime.    [provider]  Cyanocobalamin (B-12  COMPLIANCE INJECTION IJ) Inject 1 Dose as directed every 28 (twenty-eight) days.    [provider]  dicyclomine (BENTYL) 10 MG capsule Take 10 mg by mouth 3 (three) times daily. Patient not taking: Reported on 02/10/2024 07/03/22   [provider]  Ferrous Sulfate  (IRON) 90 (18 Fe) MG TABS Take 18 mg by mouth daily.    [provider]  hydrOXYzine  (ATARAX ) 25 MG tablet Take 1 tablet (25 mg total) by mouth every 6 (six) hours as needed. 02/10/24   Matthews, Jason J, MD  levonorgestrel (MIRENA) 20 MCG/24HR IUD 1 each by Intrauterine route once.    [provider]  lipase/protease/amylase (CREON ) 36000 UNITS CPEP capsule Take 72,000 units of lipase by mouth 3 (three) times daily with meals. 01/03/22   [provider]  losartan (COZAAR) 25 MG tablet Take 25 mg by mouth daily. 12/14/23   [provider]  MENATETRENONE PO Take 1 tablet by mouth every morning. 01/17/23   [provider]  metoprolol succinate (TOPROL-XL) 25 MG 24 hr tablet Take 12.5 mg by mouth daily. 12/14/23   [provider]  Multiple Vitamins-Minerals (MULTIVITAMIN WITH MINERALS) tablet Take 1 tablet by mouth daily.    [provider]  naloxone [facility]) nasal spray 4 mg/0.1 mL Give single spray in one nostril.  Repeat with 2nd device in other nostril every 3 minutes if no or minimal response until 911 arrives. Use as directed. 02/04/24 02/03/25  [provider]  naltrexone (DEPADE) 50 MG tablet Take 1 tablet (50 mg total) by mouth daily. Start nightly at half dose (25 mg), x 3 days then increase to 50 mg nightly 03/05/23 03/04/24  [provider]  nitroGLYCERIN (NITROSTAT) 0.4 MG SL tablet Place 0.4 mg under the tongue. 12/13/23 12/12/24  [provider]  pantoprazole  (PROTONIX ) 40 MG tablet TAKE 1 TABLET BY MOUTH TWICE A DAY 11/20/22   Matthews, Jason J, MD  polyethylene glycol powder (GLYCOLAX/MIRALAX) 17 GM/SCOOP powder Take 17 g by  mouth. 01/14/24 04/13/24  [provider]  pregabalin  (LYRICA ) 50 MG capsule Take 50 mg by mouth. 01/25/24 01/24/25  [provider]  promethazine (PHENERGAN) 25 MG suppository Place 25 mg rectally. 01/14/24 04/13/24  [provider]  tiZANidine  (ZANAFLEX ) 4 MG tablet Take 1 tablet (4 mg total) by mouth every 8 (eight) hours as needed for muscle spasms. 09/30/23   Matthews, Jason J, MD  Venlafaxine  HCl (EFFEXOR  XR PO) Take 75 mg by mouth daily.    [provider]  zinc sulfate, 50mg  elemental zinc, 220 (50 Zn) MG capsule Take 220 mg by mouth daily.    [provider]  escitalopram  (LEXAPRO ) 20 MG tablet Take 1 tablet by mouth daily. 02/28/18 08/26/19  [provider]  lamoTRIgine  (LAMICTAL ) 100 MG tablet Take 200 mg by mouth 2 (two) times daily.  01/15/18 08/26/19  [provider]  levETIRAcetam  (KEPPRA ) 500 MG tablet Take 1,500 mg by  mouth 2 (two) times daily.  01/15/18 08/26/19  [provider]  omeprazole  (PRILOSEC) 20 MG capsule Take 1 capsule (20 mg total) by mouth daily. 04/24/18 08/26/19  Almeda Bernard, MD    Physical Exam: Vitals:   02/27/24 0526 02/27/24 0528 02/27/24 0700 02/27/24 0718  BP: (!) 85/73     Pulse: 93  81 92  Resp: 16  16 17   Temp: 97.9 F (36.6 C)     TempSrc: Oral     SpO2: 100%  100% 99%  Weight:  52.2 kg    Height:  5' 5 (1.651 m)      Physical Exam   Constitutional: Alert, awake, calm, comfortable HEENT: Neck supple,laceration was sutured Respiratory: Clear to auscultation B/L, no wheezing, no rales.  Cardiovascular: Regular rate and rhythm, no murmurs / rubs / gallops. No extremity edema. 2+ pedal pulses. No carotid bruits.  Abdomen: Soft, no tenderness, Bowel sounds positive.  Musculoskeletal: no clubbing / cyanosis. Good ROM, no contractures. Normal muscle tone.  Skin: no rashes, lesions, ulcers. Neurologic: CN 2-12 grossly intact. Sensation intact, No focal deficit  identified Psychiatric: Alert and oriented x 3. Normal mood.    Labs on Admission: I have personally reviewed following labs and imaging studies  CBC: Recent Labs  Lab 02/27/24 0530  WBC 8.1  NEUTROABS 3.9  HGB 8.1*  HCT 25.6*  MCV 98.1  PLT 231   Basic Metabolic Panel: Recent Labs  Lab 02/27/24 0530  NA 128*  K 4.1  CL 92*  CO2 19*  GLUCOSE 118*  BUN 14  CREATININE 1.91*  CALCIUM  8.2*  MG 1.8   GFR: Estimated Creatinine Clearance: 30 mL/min (A) (by C-G formula based on SCr of 1.91 mg/dL (H)). Liver Function Tests: Recent Labs  Lab 02/27/24 0530  AST 32  ALT 9  ALKPHOS 203*  BILITOT 0.3  PROT 6.9  ALBUMIN 3.8   No results for input(s): LIPASE, AMYLASE in the last 168 hours. No results for input(s): AMMONIA in the last 168 hours. Coagulation Profile: No results for input(s): INR, PROTIME in the last 168 hours. Cardiac Enzymes: No results for input(s): CKTOTAL, CKMB, CKMBINDEX, TROPONINI, TROPONINIHS in the last 168 hours. BNP (last 3 results) No results for input(s): BNP in the last 8760 hours. HbA1C: No results for input(s): HGBA1C in the last 72 hours. CBG: No results for input(s): GLUCAP in the last 168 hours. Lipid Profile: No results for input(s): CHOL, HDL, LDLCALC, TRIG, CHOLHDL, LDLDIRECT in the last 72 hours. Thyroid Function Tests: No results for input(s): TSH, T4TOTAL, FREET4, T3FREE, THYROIDAB in the last 72 hours. Anemia Panel: No results for input(s): VITAMINB12, FOLATE, FERRITIN, TIBC, IRON, RETICCTPCT in the last 72 hours. Urine analysis:    Component Value Date/Time   COLORURINE YELLOW 08/21/2023 1310   APPEARANCEUR CLEAR 08/21/2023 1310   LABSPEC 1.010 08/21/2023 1310   PHURINE 5.5 08/21/2023 1310   GLUCOSEU 500 (A) 08/21/2023 1310   HGBUR NEGATIVE 08/21/2023 1310   BILIRUBINUR NEGATIVE 08/21/2023 1310   KETONESUR NEGATIVE 08/21/2023 1310   PROTEINUR 30 (A) 08/21/2023  1310   NITRITE NEGATIVE 08/21/2023 1310   LEUKOCYTESUR NEGATIVE 08/21/2023 1310    Radiological Exams on Admission: I have personally reviewed images CT Cervical Spine Wo Contrast Result Date: 02/27/2024 EXAM: CT CERVICAL SPINE WITHOUT CONTRAST 02/27/2024 05:58:00 AM TECHNIQUE: CT of the cervical spine was performed without the administration of intravenous contrast. Multiplanar reformatted images are provided for review. Automated exposure control, iterative reconstruction, and/or weight based adjustment of  the mA/kV was utilized to reduce the radiation dose to as low as reasonably achievable. COMPARISON: None available. CLINICAL HISTORY: Neck trauma, midline tenderness (Age 77-64y) FINDINGS: BONES AND ALIGNMENT: No acute fracture or traumatic malalignment. DEGENERATIVE CHANGES: No significant degenerative changes. SOFT TISSUES: No prevertebral soft tissue swelling. IMPRESSION: 1. No significant abnormality Electronically signed by: Evalene Coho MD 02/27/2024 06:03 AM EST RP Workstation: [id number]   CT Head Wo Contrast Result Date: 02/27/2024 EXAM: CT HEAD WITHOUT CONTRAST 02/27/2024 05:58:00 AM TECHNIQUE: CT of the head was performed without the administration of intravenous contrast. Automated exposure control, iterative reconstruction, and/or weight based adjustment of the mA/kV was utilized to reduce the radiation dose to as low as reasonably achievable. COMPARISON: None available. CLINICAL HISTORY: Head trauma, moderate-severe FINDINGS: BRAIN AND VENTRICLES: No acute hemorrhage. No evidence of acute infarct. No hydrocephalus. No extra-axial collection. No mass effect or midline shift. ORBITS: No acute abnormality. SINUSES: No acute abnormality. SOFT TISSUES AND SKULL: Bilateral TMJ degenerative changes. No acute soft tissue abnormality. No skull fracture. IMPRESSION: 1. No acute intracranial abnormality related to head trauma. 2. Bilateral TMJ degenerative changes. Electronically signed by:  Evalene Coho MD 02/27/2024 06:03 AM EST RP Workstation: [id number]    EKG: My personal interpretation of EKG shows: Sinus rhythm    Assessment/Plan Principal Problem:   Hypotension Active Problems:   Seizures (HCC)   Anxiety and depression   GERD (gastroesophageal reflux disease)   IBS (irritable bowel syndrome)    Assessment and Plan: 48 year old female W/PMH of IBS/celiac disease, anxiety/depression, seizure, GERD, CAD, history of pancreatitis who came into ED complaining of fall/dizziness and scalp laceration.  1.  Syncope/fall/scalp laceration - It appears that could be vasovagal syncope due to low blood pressure - Patient blood pressure was low and patient stated that she has been dehydrated. - Will continue to monitor her in telemetry - Scalp laceration has been repaired - CT head is negative for acute pathology - Will have PT/OT  2.  Hypotension - Patient had a diarrhea for the last for 5 days. - Will continue to monitor blood pressure and hold blood pressure medications - Will check orthostasis - Will continue IV fluid  3.  AKI - Patient reported nausea and diarrhea. - Continue IV fluid - Will continue monitor kidney function  4.  Anxiety/depression - Resume her home medications  5.  IBS/celiac disease - Continue supportive care  6.  History of pancreatitis - She was recently admitted at [facility] - Does not complain any abdominal pain  7.  Right chest wall pain. - Will get chest x-ray  8.  Hyponatremia - It appears that she has a chronic hyponatremia - Will give IV fluid normal saline and monitor  8.  Chronic anemia - Her last hemoglobin was around 7.5 - Her hemoglobin today is 8.1 - Will continue to monitor hemoglobin and hematocrit    DVT prophylaxis: SCDs Code Status: Full Code Family Communication: None  Disposition Plan: Home  Consults called: None  Admission status: Observation, Telemetry bed   Nena Rebel, MD [facility] 02/27/2024, 9:19 AM        [1]  Allergies Allergen Reactions   Dilantin [Phenytoin] Anaphylaxis   Penicillins Anaphylaxis   Gluten Meal     I have celiac   Hydromorphone  Itching    Severe itching   Morphine  Itching   Oxycodone  Hives and Itching   "

## 2024-02-27 NOTE — Evaluation (Signed)
 Physical Therapy Evaluation Patient Details Name: Crystal Mckinney MRN: [id number] DOB: 1976-02-05 Today's Date: 02/27/2024  History of Present Illness  Crystal Mckinney is a 48 y.o. female with past medical history of hypertension, CAD, pancreatitis, anemia, alcohol abuse, and seizures who presents to the ED complaining of fall.  Clinical Impression  Patient noted to be in supine position at PT arrival in room, for an initial PT evaluation due to a decline in functional status, with baseline mobility reported as independent with history of falls, and currently requiring modI with use of IV pole for hallway ambulation of 60' feet. The patient is A&O x 4, presenting with good willingness to work with PT and goals of getting better, with discharge expectations that include HHPT. The patient resides in a house with 2 STE and lives alone with family/friend support. There are no STE inside the residence. The overall clinical impression is that the patient presents with mild mobility limitations secondary to acute hospitalization.    If plan is discharge home, recommend the following: A little help with walking and/or transfers   Can travel by private vehicle        Equipment Recommendations None recommended by PT  Recommendations for Other Services       Functional Status Assessment Patient has had a recent decline in their functional status and demonstrates the ability to make significant improvements in function in a reasonable and predictable amount of time.     Precautions / Restrictions Precautions Precautions: Fall Precaution/Restrictions Comments: history of falls Restrictions Weight Bearing Restrictions Per Provider Order: No      Mobility  Bed Mobility Overal bed mobility: Needs Assistance Bed Mobility: Supine to Sit     Supine to sit: HOB elevated, Used rails, Supervision, Modified independent (Device/Increase time)          Transfers Overall transfer level: Needs  assistance Equipment used: None Transfers: Sit to/from Stand Sit to Stand: Supervision, Modified independent (Device/Increase time)                Ambulation/Gait Ambulation/Gait assistance: Supervision, Modified independent (Device/Increase time) Gait Distance (Feet): 60 Feet Assistive device: IV Pole Gait Pattern/deviations: Step-through pattern, Drifts right/left Gait velocity: steady; slightly decreased     General Gait Details: unsteady mildly shaky; no LOB during low level ambulation task  Stairs            Wheelchair Mobility     Tilt Bed    Modified Rankin (Stroke Patients Only)       Balance Overall balance assessment: Needs assistance Sitting-balance support: Feet supported Sitting balance-Leahy Scale: Normal     Standing balance support: During functional activity Standing balance-Leahy Scale: Good Standing balance comment: slight reliance on IV pole during ambulation             High level balance activites: Side stepping, Direction changes, Turns, Sudden stops, Head turns High Level Balance Comments: slightly unsteady no LOB             Pertinent Vitals/Pain Pain Assessment Pain Assessment: Faces Faces Pain Scale: Hurts even more Pain Location: R ribs and head Pain Descriptors / Indicators: Aching, Burning, Spasm Pain Intervention(s): Monitored during session, Repositioned    Home Living Family/patient expects to be discharged to:: Private residence Living Arrangements: Alone Available Help at Discharge: Family Type of Home: House Home Access: Stairs to enter       Home Layout: Multi-level;Able to live on main level with bedroom/bathroom Home Equipment: Rolling Walker (2 wheels);Cane -  single point;Wheelchair - manual;BSC/3in1      Prior Function Prior Level of Function : Independent/Modified Independent             Mobility Comments: drive and independent for all mobility       Extremity/Trunk Assessment    Upper Extremity Assessment Upper Extremity Assessment: Generalized weakness    Lower Extremity Assessment Lower Extremity Assessment: Generalized weakness    Cervical / Trunk Assessment Cervical / Trunk Assessment: Normal  Communication   Communication Communication: No apparent difficulties    Cognition Arousal: Alert Behavior During Therapy: WFL for tasks assessed/performed   PT - Cognitive impairments: No apparent impairments                         Following commands: Intact       Cueing Cueing Techniques: Verbal cues     General Comments      Exercises     Assessment/Plan    PT Assessment All further PT needs can be met in the next venue of care  PT Problem List Decreased activity tolerance;Decreased strength       PT Treatment Interventions      PT Goals (Current goals can be found in the Care Plan section)  Acute Rehab PT Goals Patient Stated Goal: Pt wants to go home and get physical therapy PT Goal Formulation: With patient Time For Goal Achievement: 03/26/24 Potential to Achieve Goals: Good    Frequency       Co-evaluation               AM-PAC PT 6 Clicks Mobility  Outcome Measure Help needed turning from your back to your side while in a flat bed without using bedrails?: None Help needed moving from lying on your back to sitting on the side of a flat bed without using bedrails?: None Help needed moving to and from a bed to a chair (including a wheelchair)?: None Help needed standing up from a chair using your arms (e.g., wheelchair or bedside chair)?: None Help needed to walk in hospital room?: None Help needed climbing 3-5 steps with a railing? : A Little 6 Click Score: 23    End of Session   Activity Tolerance: Patient tolerated treatment well Patient left: in bed;with call bell/phone within reach Nurse Communication: Mobility status PT Visit Diagnosis: Other abnormalities of gait and mobility (R26.89);Unsteadiness  on feet (R26.81)    Time: 8547-8486 PT Time Calculation (min) (ACUTE ONLY): 21 min   Charges:   PT Evaluation $PT Eval Low Complexity: 1 Low   PT General Charges $$ ACUTE PT VISIT: 1 Visit         Crystal Lesches DPT, PT    Crystal Mckinney 02/27/2024, 3:34 PM

## 2024-02-27 NOTE — ED Notes (Signed)
 IV removed, upon assessment IV was half way out, bleeding. Patient was placed on the toilet.

## 2024-02-27 NOTE — ED Notes (Signed)
Pt tolerating ginger ale 

## 2024-02-27 NOTE — Progress Notes (Signed)
 Orthostatic vitals as follows  02/27/24 1701 02/27/24 1702 02/27/24 1704  Vitals  BP (!) 145/81 (!) 148/88 (!) 139/97  MAP (mmHg) 100 106 106  BP Location Right Arm  --   --   BP Method Automatic Automatic Automatic  Patient Position (if appropriate) Lying Sitting Standing  Pulse Rate 88 91 (!) 102

## 2024-02-28 DIAGNOSIS — K581 Irritable bowel syndrome with constipation: Secondary | ICD-10-CM

## 2024-02-28 DIAGNOSIS — E861 Hypovolemia: Secondary | ICD-10-CM

## 2024-02-28 DIAGNOSIS — F419 Anxiety disorder, unspecified: Secondary | ICD-10-CM

## 2024-02-28 DIAGNOSIS — R569 Unspecified convulsions: Secondary | ICD-10-CM

## 2024-02-28 DIAGNOSIS — F32A Depression, unspecified: Secondary | ICD-10-CM

## 2024-02-28 DIAGNOSIS — K219 Gastro-esophageal reflux disease without esophagitis: Secondary | ICD-10-CM

## 2024-02-28 LAB — COMPREHENSIVE METABOLIC PANEL WITH GFR
ALT: 8 U/L (ref 0–44)
AST: 27 U/L (ref 15–41)
Albumin: 3.7 g/dL (ref 3.5–5.0)
Alkaline Phosphatase: 195 U/L — ABNORMAL HIGH (ref 38–126)
Anion gap: 10 (ref 5–15)
BUN: 9 mg/dL (ref 6–20)
CO2: 24 mmol/L (ref 22–32)
Calcium: 8.6 mg/dL — ABNORMAL LOW (ref 8.9–10.3)
Chloride: 105 mmol/L (ref 98–111)
Creatinine, Ser: 0.67 mg/dL (ref 0.44–1.00)
GFR, Estimated: >60 mL/min
Glucose, Bld: 105 mg/dL — ABNORMAL HIGH (ref 70–99)
Potassium: 4.3 mmol/L (ref 3.5–5.1)
Sodium: 138 mmol/L (ref 135–145)
Total Bilirubin: 0.2 mg/dL (ref 0.0–1.2)
Total Protein: 6.8 g/dL (ref 6.5–8.1)

## 2024-02-28 LAB — CBC
HCT: 24.7 % — ABNORMAL LOW (ref 36.0–46.0)
Hemoglobin: 8.3 g/dL — ABNORMAL LOW (ref 12.0–15.0)
MCH: 31.8 pg (ref 26.0–34.0)
MCHC: 33.6 g/dL (ref 30.0–36.0)
MCV: 94.6 fL (ref 80.0–100.0)
Platelets: 176 K/uL (ref 150–400)
RBC: 2.61 MIL/uL — ABNORMAL LOW (ref 3.87–5.11)
RDW: 12.7 % (ref 11.5–15.5)
WBC: 3.8 K/uL — ABNORMAL LOW (ref 4.0–10.5)
nRBC: 0 % (ref 0.0–0.2)

## 2024-02-28 LAB — PROTIME-INR
INR: 1.4 — ABNORMAL HIGH (ref 0.8–1.2)
Prothrombin Time: 17.9 s — ABNORMAL HIGH (ref 11.4–15.2)

## 2024-02-28 MED ORDER — PANTOPRAZOLE SODIUM 40 MG PO TBEC
40.0000 mg | DELAYED_RELEASE_TABLET | Freq: Two times a day (BID) | ORAL | Status: DC
  Administered 2024-02-28: 40 mg via ORAL
  Filled 2024-02-28: qty 1

## 2024-02-28 MED ORDER — CLOBAZAM 10 MG PO TABS
10.0000 mg | ORAL_TABLET | Freq: Every day | ORAL | Status: DC
  Filled 2024-02-28: qty 1

## 2024-02-28 MED ORDER — HYDROXYZINE HCL 25 MG PO TABS: 25.0000 mg | ORAL_TABLET | Freq: Four times a day (QID) | ORAL | Status: DC | PRN

## 2024-02-28 MED ORDER — AMLODIPINE BESYLATE 10 MG PO TABS
10.0000 mg | ORAL_TABLET | Freq: Every day | ORAL | Status: DC
  Administered 2024-02-28: 10 mg via ORAL
  Filled 2024-02-28: qty 1

## 2024-02-28 MED ORDER — CARBAMAZEPINE ER 200 MG PO TB12
500.0000 mg | ORAL_TABLET | Freq: Two times a day (BID) | ORAL | Status: DC
  Administered 2024-02-28: 500 mg via ORAL
  Filled 2024-02-28: qty 1

## 2024-02-28 MED ORDER — PREGABALIN 50 MG PO CAPS
50.0000 mg | ORAL_CAPSULE | Freq: Two times a day (BID) | ORAL | Status: DC
  Administered 2024-02-28: 50 mg via ORAL
  Filled 2024-02-28: qty 1

## 2024-02-28 MED ORDER — ADULT MULTIVITAMIN W/MINERALS CH
1.0000 | ORAL_TABLET | Freq: Every day | ORAL | Status: DC
  Administered 2024-02-28: 1 via ORAL
  Filled 2024-02-28: qty 1

## 2024-02-28 MED ORDER — AMLODIPINE BESYLATE 5 MG PO TABS: 2.5000 mg | ORAL_TABLET | Freq: Every day | ORAL | Status: DC

## 2024-02-28 MED ORDER — TIZANIDINE HCL 2 MG PO TABS: 2.0000 mg | ORAL_TABLET | Freq: Three times a day (TID) | ORAL | Status: DC | PRN

## 2024-02-28 MED ORDER — FERROUS SULFATE 325 (65 FE) MG PO TABS
325.0000 mg | ORAL_TABLET | Freq: Every day | ORAL | Status: DC
  Administered 2024-02-28: 325 mg via ORAL
  Filled 2024-02-28: qty 1

## 2024-02-28 MED ORDER — PANCRELIPASE (LIP-PROT-AMYL) 12000-38000 UNITS PO CPEP: 36000.0000 [IU] | ORAL_CAPSULE | Freq: Three times a day (TID) | ORAL | Status: DC

## 2024-02-28 MED ORDER — HYDRALAZINE HCL 50 MG PO TABS
25.0000 mg | ORAL_TABLET | Freq: Four times a day (QID) | ORAL | Status: DC | PRN
  Administered 2024-02-28: 25 mg via ORAL
  Filled 2024-02-28: qty 1

## 2024-02-28 MED ORDER — VENLAFAXINE HCL ER 75 MG PO CP24
75.0000 mg | ORAL_CAPSULE | Freq: Every day | ORAL | Status: DC
  Administered 2024-02-28: 75 mg via ORAL
  Filled 2024-02-28: qty 1

## 2024-02-28 MED ORDER — LORATADINE 10 MG PO TABS
10.0000 mg | ORAL_TABLET | Freq: Every day | ORAL | Status: DC
  Administered 2024-02-28: 10 mg via ORAL
  Filled 2024-02-28: qty 1

## 2024-02-28 NOTE — TOC Transition Note (Signed)
 Transition of Care [facility]) - Discharge Note   Patient Details  Name: Crystal Mckinney MRN: [id number] Date of Birth: 07-12-1975  [facility] CM/SW Contact:  Crystal Jackquline RAMAN, RN Phone Number: 02/28/2024, 10:52 AM   Clinical Narrative:  RNCM spoke to patient, RNCM introduced myself and my role and explained that discharge planning would be discussed. PT is recommending HH/PT. Patient is not in agreement with HH/PT. States she doesn't need it, she's been ambulating in the room by herself. States her father past away recently and she had DME at home if she needed it. Adult Nurse (2 wheels);Cane - single point;Wheelchair - manual;BSC/3in1). Spoke to patient's mom Estefana @ [phone] and informed her that the patient was being discharged today and she will be picking her up at the time of discharge. Pt has discharge orders, no further concerns. RNCM signing off.    Final next level of care: Home/Self Care Barriers to Discharge: Barriers Resolved   Patient Goals and CMS Choice            Discharge Placement                       Discharge Plan and Services Additional resources added to the After Visit Summary for                                       Social Drivers of Health (SDOH) Interventions SDOH Screenings   Food Insecurity: No Food Insecurity (02/27/2024)  Housing: Low Risk (02/27/2024)  Transportation Needs: No Transportation Needs (02/27/2024)  Utilities: Not At Risk (02/27/2024)  Depression (PHQ2-9): Low Risk (02/10/2024)  Recent Concern: Depression (PHQ2-9) - Medium Risk (12/20/2023)  Financial Resource Strain: Low Risk (01/06/2024)   Received from [facility]  Tobacco Use: Low Risk (02/10/2024)     Readmission Risk Interventions     No data to display

## 2024-02-28 NOTE — Progress Notes (Signed)
 OT Cancellation Note  Patient Details Name: Crystal Mckinney MRN: [id number] DOB: 02/25/1976   Cancelled Treatment:    Reason Eval/Treat Not Completed: OT screened, no needs identified, will sign off. Order received, chart reviewed. Pt getting OOB to go to the bathroom on entry, able to utilize IV pole and ambulate with MOD I. Reports getting up multiple times to use of the bathroom during the night without difficulty. Denies further OT needs and will sign off in house. No follow up OT indicated.  Jesiah Yerby E Chrismon 02/28/2024, 8:09 AM

## 2024-02-28 NOTE — Plan of Care (Signed)
   Problem: Education: Goal: Knowledge of General Education information will improve Description Including pain rating scale, medication(s)/side effects and non-pharmacologic comfort measures Outcome: Progressing   Problem: Health Behavior/Discharge Planning: Goal: Ability to manage health-related needs will improve Outcome: Progressing

## 2024-02-28 NOTE — Discharge Summary (Signed)
 "  Physician Discharge Summary  Crystal Mckinney FMW:[id number] DOB: 05-May-1975 DOA: 02/27/2024  PCP: Alvia Selinda PARAS, MD  Admit date: 02/27/2024 Discharge date: 02/28/2024  Admitted From: Home Disposition: Home Recommendations for Outpatient Follow-up:  Outpatient follow-up with PCP in one 1 week Check blood pressure, CMP and CBC at follow-up Recommend staple removal at follow-up Patient might be at risk for polypharmacy on multiple sedating medications.  Recommend reviewing home meds Please follow up on the following pending results: None  Home Health: HHPT/OT Equipment/Devices: None  Discharge Condition: Stable CODE STATUS: Full code   Follow-up Information     Alvia Selinda PARAS, MD. Schedule an appointment as soon as possible for a visit in 1 week(s).   Specialties: Family Medicine, Sports Medicine Contact information: [address] [place] [address] [phone]                 Hospital course 48 year old F with PMH of CAD, prior EtOH use, pancreatitis, seizure disorder, celiac disease, IBS, anxiety, depression, ADHD, GERD and anemia presented to ED after syncopal episode at home.  Patient states she was going to the bathroom when she felt dizzy and fall backward on hard floor hitting her head with estimated LOC of about 1 minute.  Denies chest pain, dyspnea or palpitation.  Afterward, she had headache and noted bleeding on the back of her head.  She says she had nausea, vomiting and diarrhea 2 to 3 days prior.  She called EMS and was brought to ED for evaluation.   In ED, soft BP to 85/73 other vital stable.  Orthostatic vitals negative Na 128.  Cr 1.91 (was 0.54 on 11/28).  Bicarb 19.  AG 17.  Glucose 118.  ALP 203.  Hgb 8.1 (was 7.4 on 02/04/2024).  WBC 8.1.  Troponin 28 and 26.  EKG NSR.  CXR, CT head and cervical spine without acute finding.  Laceration repaired/stapled by EDP.  Started on IV fluid and admitted for further care.  The next day,  patient felt well and ready to go home.  AKI and hyponatremia resolved.  No headache or focal neurodeficit.  No nausea or vomiting.  Encouraged to maintain good hydration.  Outpatient follow-up with PCP in 1 to 2 weeks or sooner if needed.  Seems to be on multiple sedating medication which could increase her risk of fall and syncope.  Recommend reviewing.  Needs staple removal in 5 to 7 days.  She says she has upcoming appointment with PCP in 1 week.  See individual problem list below for more.   Problems addressed during this hospitalization Syncope: Likely due to hypotension in the setting of dehydration.  Orthostatic vitals negative but after IV fluid.  Mild elevated troponin likely due to delayed clearance from AKI.  No chest pain.  EKG nonacute -Encourage good hydration -Review home medication given risk for polypharmacy - HHPT/OT ordered as recommended by therapy.  AKI/hyponatremia: Likely due to dehydration.  Resolved -Recheck renal function at follow-up  Elevated troponin: Likely demand ischemia.  Mild without significant delta.  EKG nonacute.  Hypotension/Uncontrolled hypertension: Initially hypotensive.  BP elevated after IV fluid.  Improved after stopping IV fluid and resuming home amlodipine .  Losartan and metoprolol on home medication list but does not seem to be taking. -Reassess blood pressure and adjust medications as appropriate  High anion gap metabolic acidosis: Unclear etiology.  Due to alcohol?  Resolved.  Scalp laceration s/p repair in ED.  7 staples.  CT head and cervical  spine without acute finding -Staple removal in 5 to 7 days.   History of seizure: -Continue home meds  Normocytic anemia: Stable - Recheck CBC at follow-up  Anxiety and depression: Stable - Continue home   Body mass index is 19.14 kg/m.           Consultations: None  Time spent 35  minutes  Vital signs Vitals:   02/28/24 0322 02/28/24 0723 02/28/24 1000 02/28/24 1134  BP: (!)  159/91 (!) 173/99 (!) 184/103 (!) 154/95  Pulse: 88 100    Temp: 99 F (37.2 C) 99.2 F (37.3 C)    Resp:  18    Height:      Weight:      SpO2: 99% 100%    TempSrc: Oral Oral    BMI (Calculated):         Discharge exam  GENERAL: No apparent distress.  Nontoxic. HEENT: MMM.  Vision and hearing grossly intact.  7 staples over posterior scalp. NECK: Supple.  No apparent JVD.  RESP:  No IWOB.  Fair aeration bilaterally. CVS:  RRR. Heart sounds normal.  ABD/GI/GU: BS+. Abd soft, NTND.  MSK/EXT:  Moves extremities. No apparent deformity. No edema.  SKIN: no apparent skin lesion or wound NEURO: Awake and alert. Oriented appropriately.  No apparent focal neuro deficit. PSYCH: Calm. Normal affect.   Discharge Instructions Discharge Instructions     Discharge instructions   Complete by: As directed    It has been a pleasure taking care of you!  You were hospitalized due to syncope, fall, head laceration and AKI likely from dehydration.  Your kidney numbers and symptoms improved with IV fluid.  Maintain good hydration.  Follow-up with your primary care doctor in 5 to 7 days for staple removal.    Take care,   Increase activity slowly   Complete by: As directed    No wound care   Complete by: As directed       Allergies as of 02/28/2024       Reactions   Dilantin [phenytoin] Anaphylaxis   Penicillins Anaphylaxis   Gluten Meal    I have celiac   Hydromorphone  Itching   Severe itching   Morphine  Itching   Oxycodone  Hives, Itching        Medication List     TAKE these medications    amLODipine  5 MG tablet Commonly known as: NORVASC  Take 2.5 mg by mouth daily.   atorvastatin  20 MG tablet Commonly known as: LIPITOR Take 20 mg by mouth daily.   B-12 COMPLIANCE INJECTION IJ Inject 1 Dose as directed every 28 (twenty-eight) days.   bisacodyl  10 MG suppository Commonly known as: DULCOLAX Place rectally.   butalbital-acetaminophen -caffeine 50-325-40 MG  tablet Commonly known as: FIORICET Take 1 tablet by mouth every 6 (six) hours as needed for migraine or headache.   carbamazepine  200 MG 12 hr tablet Commonly known as: TEGRETOL  XR Take 200 mg by mouth 2 (two) times daily. Take 200 mg (one tablet) by mouth in addition to 300 mg (three 100 mg tablets) for total 500 mg twice daily   carbamazepine  100 MG 12 hr tablet Commonly known as: TEGRETOL  XR Take 300 mg by mouth 2 (two) times daily. Take 300 mg (three tablets) by mouth in addition to 200 mg (one tablet) for total 500 mg twice daily   cetirizine 10 MG tablet Commonly known as: ZYRTEC Take 10 mg by mouth daily.   cloBAZam  10 MG tablet Commonly known as: ONFI  Take  10 mg by mouth at bedtime.   dicyclomine 10 MG capsule Commonly known as: BENTYL Take 10 mg by mouth 3 (three) times daily.   hydrOXYzine  25 MG tablet Commonly known as: ATARAX  Take 1 tablet (25 mg total) by mouth every 6 (six) hours as needed.   Iron 90 (18 Fe) MG Tabs Take 18 mg by mouth daily.   levonorgestrel 20 MCG/24HR IUD Commonly known as: MIRENA 1 each by Intrauterine route once.   lipase/protease/amylase 63999 UNITS Cpep capsule Commonly known as: CREON  Take 72,000 units of lipase by mouth 3 (three) times daily with meals.   losartan 25 MG tablet Commonly known as: COZAAR Take 25 mg by mouth daily.   metoprolol succinate 25 MG 24 hr tablet Commonly known as: TOPROL-XL Take 12.5 mg by mouth daily.   multivitamin with minerals tablet Take 1 tablet by mouth daily.   naloxone 4 MG/0.1ML Liqd nasal spray kit Commonly known as: NARCAN Give single spray in one nostril.  Repeat with 2nd device in other nostril every 3 minutes if no or minimal response until 911 arrives. Use as directed.   nitroGLYCERIN 0.4 MG SL tablet Commonly known as: NITROSTAT Place 0.4 mg under the tongue.   pantoprazole  40 MG tablet Commonly known as: PROTONIX  TAKE 1 TABLET BY MOUTH TWICE A DAY   polyethylene glycol  powder 17 GM/SCOOP powder Commonly known as: GLYCOLAX/MIRALAX Take 17 g by mouth daily as needed for mild constipation.   pregabalin  50 MG capsule Commonly known as: LYRICA  Take 50 mg by mouth 2 (two) times daily.   promethazine 25 MG suppository Commonly known as: PHENERGAN Place 25 mg rectally every 8 (eight) hours as needed for nausea or vomiting.   tiZANidine  4 MG tablet Commonly known as: ZANAFLEX  Take 1 tablet (4 mg total) by mouth every 8 (eight) hours as needed for muscle spasms.   venlafaxine  XR 75 MG 24 hr capsule Commonly known as: EFFEXOR -XR Take 75 mg by mouth daily.   vitamin C 100 MG tablet Take 100 mg by mouth daily.   Vitamin D -3 25 MCG (1000 UT) Caps Take 1,000 Units by mouth daily.   zinc sulfate (50mg  elemental zinc) 220 (50 Zn) MG capsule Take 220 mg by mouth daily.         Procedures/Studies:   DG Chest Port 1 View Result Date: 02/27/2024 EXAM: 1 VIEW(S) XRAY OF THE CHEST 02/27/2024 11:08:00 AM COMPARISON: 08/21/2023 CLINICAL HISTORY: Chest pain FINDINGS: LUNGS AND PLEURA: No focal pulmonary opacity. No pleural effusion. No pneumothorax. HEART AND MEDIASTINUM: No acute abnormality of the cardiac and mediastinal silhouettes. BONES AND SOFT TISSUES: Chronic distal right clavicular fracture. Old healed right lateral 8th and 9th rib fractures. Remote fractures involving the anterolateral aspect of the left sixth and seventh ribs. IMPRESSION: 1. No acute findings. Electronically signed by: Waddell Calk MD 02/27/2024 11:20 AM EST RP Workstation: [id number]   CT Cervical Spine Wo Contrast Result Date: 02/27/2024 EXAM: CT CERVICAL SPINE WITHOUT CONTRAST 02/27/2024 05:58:00 AM TECHNIQUE: CT of the cervical spine was performed without the administration of intravenous contrast. Multiplanar reformatted images are provided for review. Automated exposure control, iterative reconstruction, and/or weight based adjustment of the mA/kV was utilized to reduce the  radiation dose to as low as reasonably achievable. COMPARISON: None available. CLINICAL HISTORY: Neck trauma, midline tenderness (Age 77-64y) FINDINGS: BONES AND ALIGNMENT: No acute fracture or traumatic malalignment. DEGENERATIVE CHANGES: No significant degenerative changes. SOFT TISSUES: No prevertebral soft tissue swelling. IMPRESSION: 1. No significant abnormality Electronically  signed by: Evalene Coho MD 02/27/2024 06:03 AM EST RP Workstation: [id number]   CT Head Wo Contrast Result Date: 02/27/2024 EXAM: CT HEAD WITHOUT CONTRAST 02/27/2024 05:58:00 AM TECHNIQUE: CT of the head was performed without the administration of intravenous contrast. Automated exposure control, iterative reconstruction, and/or weight based adjustment of the mA/kV was utilized to reduce the radiation dose to as low as reasonably achievable. COMPARISON: None available. CLINICAL HISTORY: Head trauma, moderate-severe FINDINGS: BRAIN AND VENTRICLES: No acute hemorrhage. No evidence of acute infarct. No hydrocephalus. No extra-axial collection. No mass effect or midline shift. ORBITS: No acute abnormality. SINUSES: No acute abnormality. SOFT TISSUES AND SKULL: Bilateral TMJ degenerative changes. No acute soft tissue abnormality. No skull fracture. IMPRESSION: 1. No acute intracranial abnormality related to head trauma. 2. Bilateral TMJ degenerative changes. Electronically signed by: Evalene Coho MD 02/27/2024 06:03 AM EST RP Workstation: [id number]       The results of significant diagnostics from this hospitalization (including imaging, microbiology, ancillary and laboratory) are listed below for reference.     Microbiology: No results found for this or any previous visit (from the past 240 hours).   Labs:  CBC: Recent Labs  Lab 02/27/24 0530 02/28/24 0639  WBC 8.1 3.8*  NEUTROABS 3.9  --   HGB 8.1* 8.3*  HCT 25.6* 24.7*  MCV 98.1 94.6  PLT 231 176   BMP &GFR Recent Labs  Lab 02/27/24 0530  02/28/24 0639  NA 128* 138  K 4.1 4.3  CL 92* 105  CO2 19* 24  GLUCOSE 118* 105*  BUN 14 9  CREATININE 1.91* 0.67  CALCIUM  8.2* 8.6*  MG 1.8  --    Estimated Creatinine Clearance: 71.6 mL/min (by C-G formula based on SCr of 0.67 mg/dL). Liver & Pancreas: Recent Labs  Lab 02/27/24 0530 02/28/24 0639  AST 32 27  ALT 9 8  ALKPHOS 203* 195*  BILITOT 0.3 0.2  PROT 6.9 6.8  ALBUMIN 3.8 3.7   No results for input(s): LIPASE, AMYLASE in the last 168 hours. No results for input(s): AMMONIA in the last 168 hours. Diabetic: No results for input(s): HGBA1C in the last 72 hours. No results for input(s): GLUCAP in the last 168 hours. Cardiac Enzymes: No results for input(s): CKTOTAL, CKMB, CKMBINDEX, TROPONINI in the last 168 hours. No results for input(s): PROBNP in the last 8760 hours. Coagulation Profile: Recent Labs  Lab 02/28/24 0639  INR 1.4*   Thyroid Function Tests: No results for input(s): TSH, T4TOTAL, FREET4, T3FREE, THYROIDAB in the last 72 hours. Lipid Profile: No results for input(s): CHOL, HDL, LDLCALC, TRIG, CHOLHDL, LDLDIRECT in the last 72 hours. Anemia Panel: No results for input(s): VITAMINB12, FOLATE, FERRITIN, TIBC, IRON, RETICCTPCT in the last 72 hours. Urine analysis:    Component Value Date/Time   COLORURINE YELLOW 08/21/2023 1310   APPEARANCEUR CLEAR 08/21/2023 1310   LABSPEC 1.010 08/21/2023 1310   PHURINE 5.5 08/21/2023 1310   GLUCOSEU 500 (A) 08/21/2023 1310   HGBUR NEGATIVE 08/21/2023 1310   BILIRUBINUR NEGATIVE 08/21/2023 1310   KETONESUR NEGATIVE 08/21/2023 1310   PROTEINUR 30 (A) 08/21/2023 1310   NITRITE NEGATIVE 08/21/2023 1310   LEUKOCYTESUR NEGATIVE 08/21/2023 1310   Sepsis Labs: Invalid input(s): PROCALCITONIN, LACTICIDVEN   SIGNED:  Laiken Nohr T Kinsey Cowsert, MD  [facility] 02/28/2024, 1:37 PM   "

## 2024-02-29 ENCOUNTER — Telehealth: Payer: Self-pay

## 2024-02-29 NOTE — Transitions of Care (Post Inpatient/ED Visit) (Signed)
 "  02/29/2024  Name: Crystal Mckinney MRN: [id number] DOB: [birth year]  Today's TOC FU Call Status: Today's TOC FU Call Status:: Successful TOC FU Call Completed TOC FU Call Complete Date: 02/29/24  Patient's Name and Date of Birth confirmed. Name, DOB  Transition Care Management Follow-up Telephone Call Date of Discharge: 02/28/24 Discharge Facility: [facility]) Type of Discharge: Inpatient Admission Primary Inpatient Discharge Diagnosis:: hypotension How have you been since you were released from the hospital?: Better Any questions or concerns?: No  Items Reviewed: Did you receive and understand the discharge instructions provided?: Yes Medications obtained,verified, and reconciled?: Yes (Medications Reviewed) Any new allergies since your discharge?: No Dietary orders reviewed?: Yes Do you have support at home?: No  Medications Reviewed Today: Medications Reviewed Today     Reviewed by Emmitt Pan, LPN (Licensed Practical Nurse) on 02/29/24 at 1519  Med List Status: <None>   Medication Order Taking? Sig Documenting Provider Last Dose Status Informant  amLODipine  (NORVASC ) 5 MG tablet [id number] Yes Take 2.5 mg by mouth daily. [provider]  Active Self  Ascorbic Acid (VITAMIN C) 100 MG tablet [id number] Yes Take 100 mg by mouth daily. [provider]  Active Self  atorvastatin  (LIPITOR) 20 MG tablet [id number]  Take 20 mg by mouth daily.  Patient not taking: Reported on 02/29/2024   [provider]  Active Self  bisacodyl  (DULCOLAX) 10 MG suppository [id number]  Place rectally.  Patient not taking: Reported on 02/29/2024   [provider]  Active Self  butalbital-acetaminophen -caffeine (FIORICET) 50-325-40 MG tablet [id number] Yes Take 1 tablet by mouth every 6 (six) hours as needed for migraine or headache. [provider]  Active Self  carbamazepine  (TEGRETOL  XR) 100 MG 12 hr tablet [id number] Yes Take  300 mg by mouth 2 (two) times daily. Take 300 mg (three tablets) by mouth in addition to 200 mg (one tablet) for total 500 mg twice daily [provider]  Active Self  carbamazepine  (TEGRETOL  XR) 200 MG 12 hr tablet [id number] Yes Take 200 mg by mouth 2 (two) times daily. Take 200 mg (one tablet) by mouth in addition to 300 mg (three 100 mg tablets) for total 500 mg twice daily [provider]  Active Self  cetirizine (ZYRTEC) 10 MG tablet [id number] Yes Take 10 mg by mouth daily. [provider]  Active Self  Cholecalciferol (VITAMIN D -3) 25 MCG (1000 UT) CAPS [id number] Yes Take 1,000 Units by mouth daily. [provider]  Active Self  cloBAZam  (ONFI ) 10 MG tablet [id number] Yes Take 10 mg by mouth at bedtime. [provider]  Active Self  Cyanocobalamin (B-12 COMPLIANCE INJECTION IJ) [id number] Yes Inject 1 Dose as directed every 28 (twenty-eight) days. [provider]  Active Self  dicyclomine (BENTYL) 10 MG capsule [id number]  Take 10 mg by mouth 3 (three) times daily.  Patient not taking: Reported on 02/29/2024   [provider]  Active Self    Discontinued 08/26/19 1601 Ferrous Sulfate  (IRON) 90 (18 Fe) MG TABS [id number] Yes Take 18 mg by mouth daily. [provider]  Active Self  hydrOXYzine  (ATARAX ) 25 MG tablet [id number] Yes Take 1 tablet (25 mg total) by mouth every 6 (six) hours as needed. Alvia Selinda PARAS, MD  Active Self    Discontinued 08/26/19 1601   Discontinued 08/26/19 1601 levonorgestrel (MIRENA) 20 MCG/24HR IUD [id number] Yes 1 each by Intrauterine route once. [provider]  Active Self  lipase/protease/amylase (CREON ) 36000 UNITS CPEP capsule  [id number] Yes Take 72,000 units of lipase by mouth 3 (three) times daily with meals. [provider]  Active Self  losartan (COZAAR) 25 MG tablet [id number]  Take 25 mg by mouth daily.  Patient not taking: Reported on 02/29/2024   [provider]   Active Self  metoprolol succinate (TOPROL-XL) 25 MG 24 hr tablet [id number]  Take 12.5 mg by mouth daily.  Patient not taking: Reported on 02/29/2024   [provider]  Active Self  Multiple Vitamins-Minerals (MULTIVITAMIN WITH MINERALS) tablet [id number] Yes Take 1 tablet by mouth daily. [provider]  Active Self  naloxone [facility]) nasal spray 4 mg/0.1 mL [id number] Yes Give single spray in one nostril.  Repeat with 2nd device in other nostril every 3 minutes if no or minimal response until 911 arrives. Use as directed. [provider]  Active Self  nitroGLYCERIN (NITROSTAT) 0.4 MG SL tablet [id number] Yes Place 0.4 mg under the tongue. [provider]  Active Self    Discontinued 08/26/19 1601   pantoprazole  (PROTONIX ) 40 MG tablet [id number] Yes TAKE 1 TABLET BY MOUTH TWICE A DAY Matthews, Jason J, MD  Active Self  polyethylene glycol powder (GLYCOLAX/MIRALAX) 17 GM/SCOOP powder [id number] Yes Take 17 g by mouth daily as needed for mild constipation. [provider]  Active Self  pregabalin  (LYRICA ) 50 MG capsule [id number] Yes Take 50 mg by mouth 2 (two) times daily. [provider]  Active Self  promethazine (PHENERGAN) 25 MG suppository [id number] Yes Place 25 mg rectally every 8 (eight) hours as needed for nausea or vomiting. [provider]  Active Self  tiZANidine  (ZANAFLEX ) 4 MG tablet [id number] Yes Take 1 tablet (4 mg total) by mouth every 8 (eight) hours as needed for muscle spasms. Matthews, Jason J, MD  Active Self  venlafaxine  XR (EFFEXOR -XR) 75 MG 24 hr capsule [id number] Yes Take 75 mg by mouth daily. [provider]  Active Self  zinc sulfate, 50mg  elemental zinc, 220 (50 Zn) MG capsule [id number] Yes Take 220 mg by mouth daily. [provider]  Active Self            Home Care and Equipment/Supplies: Were Home Health Services Ordered?: NA Any new equipment or medical supplies ordered?:  NA  Functional Questionnaire: Do you need assistance with bathing/showering or dressing?: No Do you need assistance with meal preparation?: No Do you need assistance with eating?: No Do you have difficulty maintaining continence: No Do you need assistance with getting out of bed/getting out of a chair/moving?: No Do you have difficulty managing or taking your medications?: No  Follow up appointments reviewed: PCP Follow-up appointment confirmed?: No (declined) MD Provider Line [phone] Given: No Specialist Hospital Follow-up appointment confirmed?: NA Do you need transportation to your follow-up appointment?: No Do you understand care options if your condition(s) worsen?: Yes-patient verbalized understanding    SIGNATURE Julian Lemmings, LPN [facility] Nurse Health Advisor Direct Dial [phone]  "

## 2024-03-21 ENCOUNTER — Ambulatory Visit: Admitting: Family Medicine

## 2024-03-21 ENCOUNTER — Telehealth: Payer: Self-pay | Admitting: Family Medicine

## 2024-03-21 DIAGNOSIS — E538 Deficiency of other specified B group vitamins: Secondary | ICD-10-CM

## 2024-03-21 DIAGNOSIS — D649 Anemia, unspecified: Secondary | ICD-10-CM

## 2024-03-21 DIAGNOSIS — R55 Syncope and collapse: Secondary | ICD-10-CM

## 2024-03-21 DIAGNOSIS — R110 Nausea: Secondary | ICD-10-CM

## 2024-03-21 MED ORDER — ONDANSETRON 8 MG PO TBDP: 8.0000 mg | ORAL_TABLET | Freq: Three times a day (TID) | ORAL | 3 refills | Status: AC | PRN

## 2024-03-21 MED ORDER — PROMETHAZINE HCL 25 MG RE SUPP: 25.0000 mg | Freq: Three times a day (TID) | RECTAL | 0 refills | Status: AC | PRN

## 2024-03-21 NOTE — Patient Instructions (Signed)
" °  VISIT SUMMARY: During your visit, we discussed your recent syncopal episodes, blood pressure instability, persistent nausea and vomiting, and vitamin B12 deficiency. We reviewed your current medications and ongoing treatments.  YOUR PLAN: SYNCOPE WITH BLOOD PRESSURE INSTABILITY: You have been experiencing episodes of fainting and fluctuating blood pressure, including a recent head injury from a fall. -Continue with cardiology management and evaluation. -Get a complete blood count (CBC) and metabolic panel at [facility].  RECURRENT NAUSEA AND VOMITING: You have had severe nausea and vomiting, which has recently improved slightly. -Continue using Zofran  and Phenergan  suppositories as prescribed.  VITAMIN B12 DEFICIENCY: You need monthly B12 injections, which you have been administering yourself. -The nurse will contact you to coordinate your monthly B12 injections and assess if you can continue administering them at home.                      Contains text generated by Abridge.                                 Contains text generated by Abridge.   "

## 2024-03-21 NOTE — Progress Notes (Signed)
 "    Primary Care / Sports Medicine Virtual Visit  Patient Information:  Patient ID: Crystal Mckinney, female DOB: Aug 04, 1975 Age: 49 y.o. MRN: [id number]   Crystal Mckinney is a pleasant 49 y.o. female presenting with the following:  Chief Complaint  Patient presents with   Medical Management of Chronic Issues    Patient needs refills on Zofran , Phenergan  suppositories, Onfi , B12 monthly injection.     Review of Systems: No fevers, chills, night sweats, weight loss, chest pain, or shortness of breath.   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   Closed fracture of left ankle 02/10/2024   Preauricular lymphadenopathy 12/22/2023   Anemia, unspecified 12/22/2023   Opioid-induced pruritus 12/22/2023   Alcohol use disorder 12/22/2023   Protein calorie malnutrition 12/22/2023   Fatty liver 12/22/2023   Chronic pancreatitis (HCC) 12/22/2023   NSTEMI (non-ST elevated myocardial infarction) (HCC) 12/22/2023   Contusion of rib on left side 10/02/2023   Osteoporosis with current pathological fracture 10/02/2023   Celiac disease 05/02/2022   GERD (gastroesophageal reflux disease) 05/02/2022   IBS (irritable bowel syndrome) 05/02/2022   Anxiety and depression 05/01/2022   Annual physical exam 05/01/2022   Actinic keratoses 05/01/2022   Exocrine pancreatic insufficiency 01/20/2022   PUD (peptic ulcer disease) 01/20/2022   Seizures (HCC) 01/20/2022   Past Medical History:  Diagnosis Date   ADD (attention deficit disorder)    Anxiety    Cancer (HCC)    Celiac disease    GERD (gastroesophageal reflux disease)    IBS (irritable bowel syndrome)    Malnutrition    Seizures (HCC)    Outpatient Encounter Medications as of 03/21/2024  Medication Sig    amLODipine  (NORVASC ) 5 MG tablet Take 2.5 mg by mouth daily.   Ascorbic Acid (VITAMIN C) 100 MG tablet Take 100 mg by mouth daily.   butalbital-acetaminophen -caffeine (FIORICET) 50-325-40 MG tablet Take 1 tablet by mouth every 6 (six) hours as needed for migraine or headache.   carbamazepine  (TEGRETOL  XR) 100 MG 12 hr tablet Take 300 mg by mouth 2 (two) times daily. Take 300 mg (three tablets) by mouth in addition to 200 mg (one tablet) for total 500 mg twice daily   carbamazepine  (TEGRETOL  XR) 200 MG 12 hr tablet Take 200 mg by mouth 2 (two) times daily. Take 200 mg (one tablet) by mouth in addition to 300 mg (three 100 mg tablets) for total 500 mg twice daily   cetirizine (ZYRTEC) 10 MG tablet Take 10 mg by mouth daily.   Cholecalciferol (VITAMIN D -3) 25 MCG (1000 UT) CAPS Take 1,000 Units by mouth daily.   cloBAZam  (ONFI ) 10 MG tablet Take 10 mg by mouth at bedtime.   Cyanocobalamin (B-12 COMPLIANCE INJECTION IJ) Inject 1 Dose as directed every 28 (twenty-eight) days.   Ferrous Sulfate  (IRON) 90 (18 Fe) MG TABS Take 18 mg by mouth daily.   hydrOXYzine  (ATARAX ) 25 MG tablet Take 1 tablet (25 mg total) by mouth every 6 (six) hours as needed.   levonorgestrel (MIRENA) 20 MCG/24HR IUD 1 each by Intrauterine route once.   lipase/protease/amylase (CREON ) 36000 UNITS CPEP capsule Take 72,000 units of lipase by mouth 3 (three) times daily with meals.   Multiple Vitamins-Minerals (MULTIVITAMIN WITH MINERALS) tablet Take 1 tablet by mouth  daily.   naloxone (NARCAN) nasal spray 4 mg/0.1 mL Give single spray in one nostril.  Repeat with 2nd device in other nostril every 3 minutes if no or minimal response until 911 arrives. Use as directed.   nitroGLYCERIN (NITROSTAT) 0.4 MG SL tablet Place 0.4 mg under the tongue.   pantoprazole  (PROTONIX ) 40 MG tablet TAKE 1 TABLET BY MOUTH TWICE A DAY   polyethylene glycol powder (GLYCOLAX/MIRALAX) 17 GM/SCOOP powder Take 17 g by mouth daily as needed for mild  constipation.   pregabalin  (LYRICA ) 50 MG capsule Take 50 mg by mouth 2 (two) times daily.   tiZANidine  (ZANAFLEX ) 4 MG tablet Take 1 tablet (4 mg total) by mouth every 8 (eight) hours as needed for muscle spasms.   venlafaxine  XR (EFFEXOR -XR) 75 MG 24 hr capsule Take 75 mg by mouth daily.   zinc sulfate, 50mg  elemental zinc, 220 (50 Zn) MG capsule Take 220 mg by mouth daily.   [DISCONTINUED] promethazine  (PHENERGAN ) 25 MG suppository Place 25 mg rectally every 8 (eight) hours as needed for nausea or vomiting.   atorvastatin  (LIPITOR) 20 MG tablet Take 20 mg by mouth daily. (Patient not taking: Reported on 03/21/2024)   bisacodyl  (DULCOLAX) 10 MG suppository Place rectally. (Patient not taking: Reported on 03/21/2024)   dicyclomine (BENTYL) 10 MG capsule Take 10 mg by mouth 3 (three) times daily. (Patient not taking: Reported on 03/21/2024)   losartan (COZAAR) 25 MG tablet Take 25 mg by mouth daily. (Patient not taking: Reported on 03/21/2024)   metoprolol succinate (TOPROL-XL) 25 MG 24 hr tablet Take 12.5 mg by mouth daily. (Patient not taking: Reported on 03/21/2024)   ondansetron  (ZOFRAN -ODT) 8 MG disintegrating tablet Take 1 tablet (8 mg total) by mouth every 8 (eight) hours as needed for nausea.   promethazine  (PHENERGAN ) 25 MG suppository Place 1 suppository (25 mg total) rectally every 8 (eight) hours as needed for nausea or vomiting.   [DISCONTINUED] escitalopram  (LEXAPRO ) 20 MG tablet Take 1 tablet by mouth daily.   [DISCONTINUED] lamoTRIgine  (LAMICTAL ) 100 MG tablet Take 200 mg by mouth 2 (two) times daily.    [DISCONTINUED] levETIRAcetam  (KEPPRA ) 500 MG tablet Take 1,500 mg by mouth 2 (two) times daily.    [DISCONTINUED] omeprazole  (PRILOSEC) 20 MG capsule Take 1 capsule (20 mg total) by mouth daily.   No facility-administered encounter medications on file as of 03/21/2024.   Past Surgical History:  Procedure Laterality Date   ABDOMINAL SURGERY     HEMATOMA EVACUATION     HIP SURGERY  Left     Discussed the use of AI scribe software for clinical note transcription with the patient, who gave verbal consent to proceed.   Virtual Visit via MyChart Video:   I connected with Crystal Mckinney on 03/21/2024 via MyChart Video and verified that I am speaking with the correct person using appropriate identifiers.   The limitations, risks, security and privacy concerns of performing an evaluation and management service by MyChart Video, including the higher likelihood of inaccurate diagnoses and treatments, and the availability of in person appointments were reviewed. The possible need of an additional face-to-face encounter for complete and high quality delivery of care was discussed. The patient was also made aware that there may be a patient responsible charge related to this service. The patient expressed understanding and wishes to proceed.  Provider location is in medical facility. Patient location is at their home, different from provider location. People involved in care of the patient during this telehealth encounter  were myself, my nurse/medical assistant, and my front office/scheduling team member.  Objective findings:   General: Speaking full sentences, no audible heavy breathing. Sounds alert and appropriately interactive. Well-appearing. Face symmetric. Extraocular movements intact. Pupils equal and round. No nasal flaring or accessory muscle use visualized.  Independent interpretation of notes and tests performed by another provider:   None  Pertinent History, Exam, Impression, and Recommendations:   Discussed the use of AI scribe software for clinical note transcription with the patient, who gave verbal consent to proceed.  History of Present Illness   Crystal Mckinney is a 49 year old female with recurrent syncope and labile blood pressure who presents for follow-up after a recent syncopal episode and persistent nausea and vomiting.  Syncope and Blood Pressure  Instability: - Experienced a syncopal episode at home on December 21st while ambulating to the bathroom, resulting in a scalp laceration requiring seven to eight staples - Noted extremely low blood pressure throughout the day, culminating in loss of consciousness during the night - Continues to have marked fluctuations in blood pressure, with episodes of hypertension lasting two to three days, followed by periods of hypotension and subsequent normalization - No clear pattern or relation to activity - Under cardiology care for ongoing evaluation - Staples removed from scalp on December 29th - Had another syncopal episode on approximately January 9th, which she felt could have resulted in hospitalization  Nausea and Vomiting: - Persistent vomiting for four consecutive days following the syncopal episode on January 9th - Inability to tolerate food or fluids during this period - As of the day before this visit, able to keep down water and ice chips and sleep through the night for the first time since onset of vomiting - Significant setback due to prolonged inability to eat, resulting in fatigue and low energy - Uses Zofran  and Phenergan  suppositories for symptom management  Vitamin B12 Deficiency: - Self-administers monthly B12 injections, with last dose around December 29th  Functional Status: - Able to ambulate within her home but has experienced setbacks due to recent symptoms  Medications: - Currently taking Suboxone, prescribed during hospitalization - Receives Onfi  through her neurologist     Assessment and Plan    Syncope with blood pressure instability Continued syncope with hypertension and hypotension despite minimal exertion. Recent head trauma from syncope resolved post staple removal. Ongoing cardiology management and evaluation needed. - Ordered CBC and metabolic panel. - Instructed her to obtain lab tests at [facility].  Recurrent nausea and vomiting Severe recurrent  episodes with recent four-day emesis, previously responsive to antiemetics. Currently tolerating small fluid intake. - Prescribed Zofran  and Phenergan  suppositories.  Vitamin B12 deficiency Requires monthly B12 injections, self-administered. Next dose due end of month. Coordination of therapy ongoing. - Nurse to contact her for monthly B12 injection coordination and assess home administration feasibility.        I discussed the above assessment and treatment plan with the patient. The patient was provided an opportunity to ask questions and all were answered. The patient agreed with the plan and demonstrated an understanding of the instructions.   The patient was advised to call back or seek an in-person evaluation if the symptoms worsen or if the condition fails to improve as anticipated.   I provided a total time of 30 minutes including both face-to-face and non-face-to-face time on 03/21/2024 inclusive of time utilized for medical chart review, information gathering, care coordination with staff, and documentation completion.    Crystal Kinder J Hazelgrace Bonham, MD,  [facility]   Primary Care Sports Medicine Primary Care and Sports Medicine at [facility]   "

## 2024-04-07 ENCOUNTER — Ambulatory Visit: Payer: Self-pay
